# Patient Record
Sex: Male | Born: 1965 | Hispanic: Yes | Marital: Married | Smoking: Former smoker
Health system: Southern US, Community
[De-identification: ages and names within clinical notes are randomized; demographics above are authoritative.]

## PROBLEM LIST (undated history)

## (undated) DIAGNOSIS — G4730 Sleep apnea, unspecified: Secondary | ICD-10-CM

## (undated) DIAGNOSIS — R600 Localized edema: Secondary | ICD-10-CM

## (undated) DIAGNOSIS — I10 Essential (primary) hypertension: Secondary | ICD-10-CM

## (undated) DIAGNOSIS — T7840XA Allergy, unspecified, initial encounter: Secondary | ICD-10-CM

## (undated) DIAGNOSIS — Z9884 Bariatric surgery status: Secondary | ICD-10-CM

## (undated) DIAGNOSIS — Z86718 Personal history of other venous thrombosis and embolism: Secondary | ICD-10-CM

## (undated) DIAGNOSIS — K219 Gastro-esophageal reflux disease without esophagitis: Secondary | ICD-10-CM

## (undated) HISTORY — DX: Essential (primary) hypertension: I10

## (undated) HISTORY — PX: GASTRIC BYPASS: SHX52

## (undated) HISTORY — PX: KNEE ARTHROSCOPY: SUR90

## (undated) HISTORY — DX: Personal history of other venous thrombosis and embolism: Z86.718

## (undated) HISTORY — DX: Allergy, unspecified, initial encounter: T78.40XA

## (undated) HISTORY — DX: Sleep apnea, unspecified: G47.30

---

## 2013-03-15 ENCOUNTER — Ambulatory Visit: Payer: PRIVATE HEALTH INSURANCE | Attending: Family Medicine | Admitting: Family Medicine

## 2013-03-15 ENCOUNTER — Encounter (HOSPITAL_COMMUNITY): Payer: Self-pay | Admitting: Emergency Medicine

## 2013-03-15 ENCOUNTER — Encounter: Payer: Self-pay | Admitting: Family Medicine

## 2013-03-15 ENCOUNTER — Emergency Department (HOSPITAL_COMMUNITY)
Admission: EM | Admit: 2013-03-15 | Discharge: 2013-03-15 | Disposition: A | Discharge status: Home / Self Care | Payer: PRIVATE HEALTH INSURANCE | Source: Home / Self Care

## 2013-03-15 VITALS — BP 145/91 | HR 89 | Temp 98.7°F | Resp 20 | Ht 71.0 in | Wt 394.0 lb

## 2013-03-15 DIAGNOSIS — L089 Local infection of the skin and subcutaneous tissue, unspecified: Secondary | ICD-10-CM

## 2013-03-15 DIAGNOSIS — M79609 Pain in unspecified limb: Secondary | ICD-10-CM | POA: Insufficient documentation

## 2013-03-15 DIAGNOSIS — R600 Localized edema: Secondary | ICD-10-CM

## 2013-03-15 DIAGNOSIS — I878 Other specified disorders of veins: Secondary | ICD-10-CM

## 2013-03-15 DIAGNOSIS — R609 Edema, unspecified: Secondary | ICD-10-CM

## 2013-03-15 DIAGNOSIS — Z0000 Encounter for general adult medical examination without abnormal findings: Secondary | ICD-10-CM

## 2013-03-15 DIAGNOSIS — IMO0001 Reserved for inherently not codable concepts without codable children: Secondary | ICD-10-CM

## 2013-03-15 DIAGNOSIS — L97909 Non-pressure chronic ulcer of unspecified part of unspecified lower leg with unspecified severity: Secondary | ICD-10-CM

## 2013-03-15 DIAGNOSIS — M79661 Pain in right lower leg: Secondary | ICD-10-CM

## 2013-03-15 DIAGNOSIS — I872 Venous insufficiency (chronic) (peripheral): Secondary | ICD-10-CM

## 2013-03-15 DIAGNOSIS — M7989 Other specified soft tissue disorders: Secondary | ICD-10-CM | POA: Insufficient documentation

## 2013-03-15 LAB — CBC WITH DIFFERENTIAL/PLATELET
Basophils Absolute: 0 10*3/uL (ref 0.0–0.1)
Basophils Relative: 0 % (ref 0–1)
Eosinophils Absolute: 0.2 10*3/uL (ref 0.0–0.7)
Eosinophils Relative: 3 % (ref 0–5)
Lymphocytes Relative: 43 % (ref 12–46)
MCHC: 35.7 g/dL (ref 30.0–36.0)
MCV: 87.5 fL (ref 78.0–100.0)
Monocytes Absolute: 0.5 10*3/uL (ref 0.1–1.0)
Platelets: 220 10*3/uL (ref 150–400)
RDW: 12.9 % (ref 11.5–15.5)
WBC: 7 10*3/uL (ref 4.0–10.5)

## 2013-03-15 LAB — LDL CHOLESTEROL, DIRECT: Direct LDL: 105 mg/dL — ABNORMAL HIGH

## 2013-03-15 LAB — COMPREHENSIVE METABOLIC PANEL
ALT: 99 U/L — ABNORMAL HIGH (ref 0–53)
AST: 50 U/L — ABNORMAL HIGH (ref 0–37)
Alkaline Phosphatase: 47 U/L (ref 39–117)
BUN: 11 mg/dL (ref 6–23)
Chloride: 102 mEq/L (ref 96–112)
Creat: 0.89 mg/dL (ref 0.50–1.35)
Total Bilirubin: 0.5 mg/dL (ref 0.3–1.2)

## 2013-03-15 LAB — TSH: TSH: 1.369 u[IU]/mL (ref 0.350–4.500)

## 2013-03-15 MED ORDER — BACITRACIN 500 UNIT/GM EX OINT: 1.0000 "application " | TOPICAL_OINTMENT | Freq: Two times a day (BID) | CUTANEOUS | Status: DC

## 2013-03-15 NOTE — Progress Notes (Signed)
Subjective:     Patient ID: Manuel Price, male   DOB: [birth year], 47 y.o.   MRN: [id number]  HPI Pt here for f/u from an urgent care visit a few hours ago. He was seen there for a small ulceration on the back of his left calf. He also endoresed b/l calf tenderness and swelling both for 15 years. He denies any medical problems and has not seen a pcp for "many many years."    Review of Systems + calf tenderness and swelling, no shob     Objective:   Physical Examulceration on the back of right calf, pencil eraser sized, no erythema surrounding, streaking, or warmth. 1+ pitting edema to mid shin. Morbidly obese.      Assessment:     Skin infection - Plan: bacitracin 500 UNIT/GM ointment  Healthcare maintenance - Plan: CBC with Differential, Comprehensive metabolic panel, Hemoglobin A1c, LDL Cholesterol, Direct, TSH, Vitamin D, 25-hydroxy  Bilateral calf pain - Plan: Lower Extremity Venous Duplex Bilateral       Plan:     - cont the bacitracin as suggested by UC, also shower twice daily and rinse the area with shower head (he has detatchable), re-eval in 3-4 days or earlier if worsens.   - check b/l dopplers agiven pain, swelling, and sedentary lifestyle lthough suscpicion for dvts is low  -  Will have him f/u next tues to re-eval the ulceration and review his dppler results with him. Will draw basic labs today and will review those at that visit. Note that BP elevated today without dx of HTN so will also look at that next week.

## 2013-03-15 NOTE — ED Notes (Signed)
Right lower leg swelling.  15 year history of legs swelling.  Leg worsened over the past year.  Patient has a weeping wound on back of lower leg.

## 2013-03-15 NOTE — Progress Notes (Signed)
Patient presents to establish care; denies pain; denies need for medication refills.

## 2013-03-15 NOTE — ED Provider Notes (Signed)
History     CSN: [id number]  Arrival date & time 03/15/13  1013   First MD Initiated Contact with Patient 03/15/13 1031      Chief Complaint  Patient presents with  . Leg Swelling    (Consider location/radiation/quality/duration/timing/severity/associated sxs/prior treatment) HPI Comments: This is a 48 year old morbidly and severely obese male who has had bilateral leg swelling for over 15 years. His daughter states that it is beginning to get worse in the past 1-1/2 years. The reason for his visit today was the he had recently developed a small opening in the skin on the posterior calf. It is not painful. It is not infected. He denies systemic symptoms. No fever, chest pain or shortness of breath.   History reviewed. No pertinent past medical history.  Past Surgical History  Procedure Laterality Date  . Knee surgery Right     No family history on file.  History  Substance Use Topics  . Smoking status: Never Smoker   . Smokeless tobacco: Not on file  . Alcohol Use: No      Review of Systems  Constitutional: Negative.   Cardiovascular: Negative.   Gastrointestinal: Negative.   Skin: Positive for wound.  Neurological: Negative.     Allergies  Review of patient's allergies indicates no known allergies.  Home Medications   Current Outpatient Rx  Name  Route  Sig  Dispense  Refill  . aspirin 325 MG tablet   Oral   Take 325 mg by mouth daily.           BP 142/85  Pulse 73  Temp(Src) 98.8 F (37.1 C) (Oral)  Resp 17  SpO2 96%  Physical Exam  Nursing note and vitals reviewed. Constitutional: He is oriented to person, place, and time. He appears well-developed and well-nourished.  Eyes: EOM are normal.  Neck: Normal range of motion. Neck supple.  Cardiovascular: Normal rate, regular rhythm and normal heart sounds.   Pulmonary/Chest: Effort normal. No respiratory distress. He has no wheezes.  Musculoskeletal: He exhibits edema.  Bilateral lower  extremity edema. 1+ pitting. Right mildly greater than the left. There is overlying skin changes consistent with venous stasis. There is a 3-4 mm diameter area in which the epidermis is absent. There is no drainage or signs of infection. No erythema or lymphangitis or bleeding. There is no ulceration.  Neurological: He is alert and oriented to person, place, and time. He exhibits normal muscle tone.  Skin: Skin is warm and dry.  Psychiatric: He has a normal mood and affect.    ED Course  Procedures (including critical care time)  Labs Reviewed - No data to display No results found.   1. Venous stasis of lower extremity   2. Venous stasis ulcer, right   3. Bilateral lower extremity edema   4. Morbid obesity       MDM  Chronic bilateral edema of with right slightly greater than left. The appearance is that of venous stasis. The right wound is no and does not appear to be an actual ulcer at this time. There are no signs of infection. We will treat with bacitracin cream or ointment and a Band-Aid. They are to keep it clean and continuing apply the ointment or cream for the next 4-5 days. [facility] for followup appointment probably will need lower bilateral venous Dopplers. The patient is severely a morbidly obese and will need to have medical evaluation for her comorbid conditions. He is stable at  discharge with no other complaints.      Hayden Rasmussen, NP 03/15/13 1136

## 2013-03-16 LAB — VITAMIN D 25 HYDROXY (VIT D DEFICIENCY, FRACTURES): Vit D, 25-Hydroxy: 28 ng/mL — ABNORMAL LOW (ref 30–89)

## 2013-03-17 ENCOUNTER — Ambulatory Visit (HOSPITAL_COMMUNITY): Payer: PRIVATE HEALTH INSURANCE | Attending: Family Medicine

## 2013-03-19 ENCOUNTER — Telehealth: Payer: Self-pay | Admitting: *Deleted

## 2013-03-19 ENCOUNTER — Ambulatory Visit: Payer: PRIVATE HEALTH INSURANCE

## 2013-03-19 NOTE — Telephone Encounter (Signed)
03/19/13 Patient made aware of lab results CBC-wnl  A1c-wnl, Vit. D low per Dr. Lucretia Roers Patient schedule for clinic tomorrow 03/20/13 Will discuss with doctor . [facility] BSN  [facility]

## 2013-03-20 ENCOUNTER — Ambulatory Visit: Payer: PRIVATE HEALTH INSURANCE | Attending: Family Medicine | Admitting: Internal Medicine

## 2013-03-20 VITALS — BP 114/79 | HR 78 | Temp 98.4°F | Resp 18 | Ht 71.0 in | Wt 394.0 lb

## 2013-03-20 DIAGNOSIS — L089 Local infection of the skin and subcutaneous tissue, unspecified: Secondary | ICD-10-CM

## 2013-03-20 DIAGNOSIS — E559 Vitamin D deficiency, unspecified: Secondary | ICD-10-CM | POA: Insufficient documentation

## 2013-03-20 MED ORDER — BACITRACIN 500 UNIT/GM EX OINT: 1.0000 "application " | TOPICAL_OINTMENT | Freq: Two times a day (BID) | CUTANEOUS | Status: DC

## 2013-03-20 MED ORDER — VITAMIN D 400 UNITS PO CAPS: 400.0000 [IU] | ORAL_CAPSULE | Freq: Every day | ORAL | Status: DC

## 2013-03-20 NOTE — Patient Instructions (Signed)
Vitamin D Deficiency  Vitamin D is an important vitamin that your body needs. Having too little of it in your body is called a deficiency. A very bad deficiency can make your bones soft and can cause a condition called rickets.   Vitamin D is important to your body for different reasons, such as:    It helps your body absorb 2 minerals called calcium and phosphorus.   It helps make your bones healthy.   It may prevent some diseases, such as diabetes and multiple sclerosis.   It helps your muscles and heart.  You can get vitamin D in several ways. It is a natural part of some foods. The vitamin is also added to some dairy products and cereals. Some people take vitamin D supplements. Also, your body makes vitamin D when you are in the sun. It changes the sun's rays into a form of the vitamin that your body can use.  CAUSES    Not eating enough foods that contain vitamin D.   Not getting enough sunlight.   Having certain digestive system diseases that make it hard to absorb vitamin D. These diseases include Crohn's disease, chronic pancreatitis, and cystic fibrosis.   Having a surgery in which part of the stomach or small intestine is removed.   Being obese. Fat cells pull vitamin D out of your blood. That means that obese people may not have enough vitamin D left in their blood and in other body tissues.   Having chronic kidney or liver disease.  RISK FACTORS  Risk factors are things that make you more likely to develop a vitamin D deficiency. They include:   Being older.   Not being able to get outside very much.   Living in a nursing home.   Having had broken bones.   Having weak or thin bones (osteoporosis).   Having a disease or condition that changes how your body absorbs vitamin D.   Having dark skin.   Some medicines such as seizure medicines or steroids.   Being overweight or obese.  SYMPTOMS   Mild cases of vitamin D deficiency may not have any symptoms. If you have a very bad case, symptoms may include:   Bone pain.   Muscle pain.   Falling often.   Broken bones caused by a minor injury, due to osteoporosis.  DIAGNOSIS  A blood test is the best way to tell if you have a vitamin D deficiency.  TREATMENT  Vitamin D deficiency can be treated in different ways. Treatment for vitamin D deficiency depends on what is causing it. Options include:   Taking vitamin D supplements.   Taking a calcium supplement. Your caregiver will suggest what dose is best for you.  HOME CARE INSTRUCTIONS   Take any supplements that your caregiver prescribes. Follow the directions carefully. Take only the suggested amount.   Have your blood tested 2 months after you start taking supplements.   Eat foods that contain vitamin D. Healthy choices include:   Fortified dairy products, cereals, or juices. Fortified means vitamin D has been added to the food. Check the label on the package to be sure.   Fatty fish like salmon or trout.   Eggs.   Oysters.   Spend some time in the sun. Most people should get out in the sun without sunblock for 10 to 15 minutes at a time. Do this 3 times a week. People who have had skin cancer should not do this. Ask your   caregiver how long you should be in the sun. Do not use a tanning bed.   Keep your weight at a healthy level. Lose weight if you need to.   Keep all follow-up appointments. Your caregiver will need to perform blood tests to make sure your vitamin D deficiency is going away.  SEEK MEDICAL CARE IF:   You have any questions about your treatment.   You continue to have symptoms of vitamin D deficiency.   You have nausea or vomiting.   You are constipated.   You feel confused.   You have severe abdominal or back pain.  MAKE SURE YOU:   Understand these instructions.   Will watch your condition.   Will get help right away if you are not doing well or get worse.   Document Released: 12/12/2011 Document Reviewed: 12/12/2011  [employer] Patient Information 2014 [employer]

## 2013-03-20 NOTE — Progress Notes (Signed)
Patient ID: Manuel Price, male   DOB: [birth year], 47 y.o.   MRN: [id number]  CC: follow up for labs  HPI: 47 year old male with no significant past medical history who presents to the clinic for followup. Patient reports feeling good today with no chest pain, shortness of breath or palpitations. He came to review the blood work done at previous visit.  No Known Allergies History reviewed. No pertinent past medical history. Current Outpatient Prescriptions on File Prior to Visit  Medication Sig Dispense Refill  . aspirin 325 MG tablet Take 325 mg by mouth daily.       No current facility-administered medications on file prior to visit.   Family History  Problem Relation Age of Onset  . Stroke Mother    History   Social History  . Marital Status: Married    Spouse Name: N/A    Number of Children: N/A  . Years of Education: N/A   Occupational History  . Not on file.   Social History Main Topics  . Smoking status: Never Smoker   . Smokeless tobacco: Not on file  . Alcohol Use: No  . Drug Use: No  . Sexually Active: Not on file   Other Topics Concern  . Not on file   Social History Narrative  . No narrative on file    Review of Systems  Constitutional: Negative for fever, chills, diaphoresis, activity change, appetite change and fatigue.  HENT: Negative for ear pain, nosebleeds, congestion, facial swelling, rhinorrhea, neck pain, neck stiffness and ear discharge.   Eyes: Negative for pain, discharge, redness, itching and visual disturbance.  Respiratory: Negative for cough, choking, chest tightness, shortness of breath, wheezing and stridor.   Cardiovascular: Negative for chest pain, palpitations and leg swelling.  Gastrointestinal: Negative for abdominal distention.  Genitourinary: Negative for dysuria, urgency, frequency, hematuria, flank pain, decreased urine volume, difficulty urinating and dyspareunia.  Musculoskeletal: Negative for back pain, joint swelling,  arthralgias and gait problem.  Neurological: Negative for dizziness, tremors, seizures, syncope, facial asymmetry, speech difficulty, weakness, light-headedness, numbness and headaches.  Hematological: Negative for adenopathy. Does not bruise/bleed easily.  Psychiatric/Behavioral: Negative for hallucinations, behavioral problems, confusion, dysphoric mood, decreased concentration and agitation.    Objective:   Filed Vitals:   03/20/13 1300  BP: 114/79  Pulse: 78  Temp: 98.4 F (36.9 C)  Resp: 18    Physical Exam  Constitutional: Appears well-developed and well-nourished. No distress.  HENT: Normocephalic. External right and left ear normal. Oropharynx is clear and moist.  Eyes: Conjunctivae and EOM are normal. PERRLA, no scleral icterus.  Neck: Normal ROM. Neck supple. No JVD. No tracheal deviation. No thyromegaly.  CVS: RRR, S1/S2 +, no murmurs, no gallops, no carotid bruit.  Pulmonary: Effort and breath sounds normal, no stridor, rhonchi, wheezes, rales.  Abdominal: Soft. BS +,  no distension, tenderness, rebound or guarding.  Musculoskeletal: Normal range of motion. No edema and no tenderness.  Lymphadenopathy: No lymphadenopathy noted, cervical, inguinal. Neuro: Alert. Normal reflexes, muscle tone coordination. No cranial nerve deficit. Skin: Skin is warm and dry. No rash noted. Not diaphoretic. No erythema. No pallor.  Psychiatric: Normal mood and affect. Behavior, judgment, thought content normal.   Lab Results  Component Value Date   WBC 7.0 03/15/2013   HGB 14.8 03/15/2013   HCT 41.4 03/15/2013   MCV 87.5 03/15/2013   PLT 220 03/15/2013   Lab Results  Component Value Date   CREATININE 0.89 03/15/2013   BUN 11 03/15/2013   NA  135 03/15/2013   K 3.9 03/15/2013   CL 102 03/15/2013   CO2 27 03/15/2013    Lab Results  Component Value Date   HGBA1C 5.5 03/15/2013   Lipid Panel  No results found for this basename: chol, trig, hdl, cholhdl, vldl, ldlcalc       Assessment  and plan:   Patient Active Problem List   Diagnosis Date Noted  . Vitamin D deficiency 03/20/2013    Priority: High - Prescription provided for vitamin D capsules 400 mg daily  - Vitamin D level low at 28  - Reviewed TSH, A1c and lipid panel which seem to be under relatively good control

## 2013-03-20 NOTE — Addendum Note (Signed)
Addended by: Alison Murray on: 03/20/2013 01:15 PM   Modules accepted: Orders

## 2013-03-20 NOTE — Progress Notes (Signed)
Patient presents to review labs; also states that he is still using ointment for wound on right leg.

## 2013-04-11 ENCOUNTER — Encounter (HOSPITAL_BASED_OUTPATIENT_CLINIC_OR_DEPARTMENT_OTHER): Payer: PRIVATE HEALTH INSURANCE

## 2013-04-20 ENCOUNTER — Ambulatory Visit (INDEPENDENT_AMBULATORY_CARE_PROVIDER_SITE_OTHER): Payer: PRIVATE HEALTH INSURANCE | Admitting: Emergency Medicine

## 2013-04-20 VITALS — BP 130/82 | HR 82 | Temp 98.3°F | Resp 20 | Ht 72.0 in | Wt 396.4 lb

## 2013-04-20 DIAGNOSIS — J018 Other acute sinusitis: Secondary | ICD-10-CM

## 2013-04-20 DIAGNOSIS — G4733 Obstructive sleep apnea (adult) (pediatric): Secondary | ICD-10-CM

## 2013-04-20 DIAGNOSIS — I87009 Postthrombotic syndrome without complications of unspecified extremity: Secondary | ICD-10-CM

## 2013-04-20 DIAGNOSIS — J019 Acute sinusitis, unspecified: Secondary | ICD-10-CM

## 2013-04-20 MED ORDER — PSEUDOEPHEDRINE-GUAIFENESIN ER 60-600 MG PO TB12: 1.0000 | ORAL_TABLET | Freq: Two times a day (BID) | ORAL | Status: DC

## 2013-04-20 MED ORDER — AMOXICILLIN-POT CLAVULANATE 875-125 MG PO TABS: 1.0000 | ORAL_TABLET | Freq: Two times a day (BID) | ORAL | Status: DC

## 2013-04-20 NOTE — Patient Instructions (Addendum)
Apnea del sueo  (Sleep Apnea)  La apnea del sueo es un trastorno caracterizado por pausas anormales en la respiracin durante el sueo. Cuando la respiracin se detiene, Air cabin crew de oxgeno en la sangre disminuye. Esto hace que salga del sueo profundo y [place] en el sueo ligero. [employer], la calidad del sueo es pobre y el sistema que transporta la sangre a travs del cuerpo (sistema cardiovascular) experimenta estrs. Si la apnea del sueo no se trata, Building services engineer los siguientes problemas:   Presin arterial elevada (hipertensin).  Enfermedad coronaria.  Incapacidad para alcanzar o Designer, fashion/clothing (impotencia).  Deterioro de los procesos de pensamiento (disfuncin cognitiva). Existen tres tipos de apnea del sueo.  1. Apnea del sueo obstructiva - Pausas en la respiracin durante el sueo debido a una obstruccin de las vas areas. 2. Apnea del sueo central -Pausas en la respiracin durante el sueo debido a que la zona del cerebro que controla la respiracin no enva las seales correctas a los msculos que la controlan. 3. Apnea del sueo mixta - [place] combinacin de [place], tanto obstructiva como central. FACTORES DE RIESGO  Sin embargo, los siguientes factores pueden aumentar el riesgo de desarrollar apnea del sueo.   Sobrepeso.  El hbito de fumar.  Pasajes estrechos en la nariz y la garganta.  Edad avanzada.  Sexo masculino.  El consumo de alcohol.  Uso de sedantes y tranquilizantes.  La etnia. Entre los individuos menores de 35 aos, los afroamericanos tienen ms riesgo. SNTOMAS   Dificultad para permanecer dormido.  Somnolencia y Doctor, hospital.  Prdida de energa.  Irritabilidad.  Fuertes ronquidos.  Dolores de cabeza matutinos.  Dificultad para concentrarse.  Olvidos.  Disminucin del inters por el sexo. DIAGNSTICO  Para diagnosticar la apnea del sueo, Public librarian un examen fsico. Su mdico podr indicarle un  estudio del sueo para Transport planner. Tambin podr indicarle que pase la noche en un laboratorio del sueo. En el laboratorio del sueo, varios monitores registran informacin sobre el corazn, los pulmones y el cerebro durante el sueo. Tambin se registran los movimientos de brazos y [place], y Air cabin crew de oxgeno en la sangre.  TRATAMIENTO  Las acciones siguientes pueden ayudar a Oncologist la apnea del sueo leve:   Duerma de lado.   Use un descongestivo, tiene congestin nasal.   Evite el uso de depresores, incluyendo el alcohol, sedantes y narcticos.   Baje de peso y modifique su dieta si tiene sobrepeso. Existen dispositivos y tratamientos para ayudar a abrir las vas respiratorias:   Dispositivos bucales. Son boquillas a medida que modifican ligeramente la posicin de la mandbula inferior [place] adelante y abren la mordida. Esto permite la apertura de las vas respiratorias.  Dispositivos que generan presin positiva en las vas areas. Esta presin positiva [employer] vas respiratorias para ayudar a Therapist, nutritional sueo. Los siguientes dispositivos crean una presin area positiva:  Dispositivo de presin positiva continua de las vas respiratorias (CPAP). Este dispositivo CPAP crea un nivel continuo de presin de aire con [place] bomba de aire. El aire se enva a las vas respiratorias a travs de una mscara durante el sueo. Esta presin continua mantiene las vas areas [place].  Dispositivo de presin espiratoria positiva en las vas areas (EPAP). El dispositivo EPAP crea una presin positiva de aire a medida que se exhala. Consta de vlvulas de un solo uso que se insertan en cada fosa nasal y las mantienen en su  sitio mediante un [employer]. Las vlvulas crean muy poca resistencia al inhalar pero crean resistencia al exhalar. Ese aumento de la resistencia crea una presin de [place]. Esta presin positiva al exhalar mantiene abiertas las vas  respiratorias, lo que facilita la respiracin al inhalar nuevamente.  Dispositivo de presin positiva de [place] [employer] vas areas (BPAP). El dispositivo BPAP se utiliza principalmente en pacientes con apnea del sueo central. Este dispositivo es similar al dispositivo de CPAP ya que utiliza una bomba de aire para suministrar presin de aire continua a travs de Tourist information centre manager. Sin embargo, con el dispositivo BPAP la presin se Naval architect. Al exhalar, la presin es inferior a la presin que se establece al [employer].  Ciruga. Por lo general, la ciruga slo se realiza si no puede cumplir con los tratamientos menos invasivos o si estos no mejoran su requisito. La ciruga consiste en extraer el exceso de tejidos en las vas areas para crear un pasaje ms amplio. Document Released: 06/29/2005 Document Revised: 03/20/2012 [facility] Patient Information 2014 [place], [place]. Sinusitis  (Sinusitis) La sinusitis es el enrojecimiento, dolor e hinchazn (inflamacin) de los senos paranasales. Los senos paranasales son bolsas de aire que se encuentran dentro de los [employer] del rostro (por debajo de los ojos, en la mitad de la frente o por encima de los ojos). En los senos paranasales sanos, el moco es capaz de Sales executive y el aire circula a travs de ellos en su camino hacia la Clinical cytogeneticist. Sin embargo, cuando se [place], el moco y el aire [place]. Esto hace que se desarrollen bacterias y otros grmenes y originen una infeccin.   La sinusitis puede desarrollarse rpidamente y durar slo un tiempo corto (aguda) o continuar por un perodo largo (crnica).. La sinusitis que dura ms de 12 semanas se considera crnica.  CAUSAS  Las causas de la sinusitis son:   [place].  Las [employer], como el desplazamiento del cartlago que separa las fosas nasales (desvo del tabique) pueden disminuir el flujo de aire por la nariz y los senos paranasales y Audiological scientist su drenaje.  Las  alteraciones funcionales, como cuando los pequeos pelos (cilias) que se encuentran en los senos nasales y ayudan a eliminar la mucosidad no funcionan correctamente o no estn presentes. SNTOMAS  Los sntomas de sinusitis aguda y crnica son los mismos. Los sntomas principales son Chief Technology Officer y la presin alrededor de los senos paranasales afectados. Otros sntomas son:   Careers information officer.  Dolor de odos  Dolor de [place].  Mal aliento.  Disminucin del sentido del olfato y del gusto.  Tos, que empeora al [employer]  [place].  Grant Ruts.  Drenaje de moco espeso por la nariz, que generalmente es de color verde y puede contener pus (purulento).  Hinchazn y calor en los senos paranasales afectados. DIAGNSTICO  El mdico le har un examen fsico. Durante el examen, el mdico:   Revisar su nariz buscando signos de crecimientos anormales en las fosas nasales (plipos nasales).  Palpar los senos paranasales afectados para buscar signos de infeccin.  Observar el interior de los senos paranasales (endoscopa) con un dispositivo luminoso especial (endoscopio) con el que tomar imgenes insertndolo en los senos paranasales. Si el mdico sospecha que usted sufre sinusitis crnica, podr indicar una o ms de las siguientes pruebas:   Pruebas de Programmer, multimedia.  Cultivo de secreciones nasales: tomar Lauris Poag del moco nasal y lo enviar a un laboratorio para detectar bacterias.  Citologa nasal: el  mdico tomar Lauris Poag de moco de la nariz para determinar si la sinusitis que usted sufre est relacionada con Vella Raring. TRATAMIENTO  La mayora de los casos de sinusitis aguda se deben a una infeccin viral y se resuelven espontneamente dentro de los [address]. En algunos casos se recetan medicamentos para [employer] (analgsicos, descongestivos, aerosoles nasales con corticoides o aerosoles salinos).  Sin embargo, para la sinusitis por infeccin bacteriana, Engineer, manufacturing systems. Los antibiticos son medicamentos que destruyen las bacterias que causan la infeccin.  Rara vez la sinusitis tiene su origen en una infeccin por hongos. . En estos casos, el mdico le recetar un medicamento antifngico.  Para algunos casos de sinusitis crnica, es necesario someterse a [place]. Generalmente se trata de [employer] la sinusitis se repite ms de 3 veces al ao, a pesar de otros tratamientos.  INSTRUCCIONES PARA EL CUIDADO EN EL HOGAR   Beba gran cantidad de lquidos. Los lquidos ayudan a Optometrist moco para que drene ms fcilmente de los senos paranasales.  Use un humidificador.  Inhale vapor de 3 a 4 veces al da (por ejemplo, sintese en el bao con la ducha abierta).  Aplique un pao tibio y hmedo en el rostro 3  4 veces al da, o segn las indicaciones de su mdico.  Use un aerosol nasal salino para ayudar a Environmental education officer y [employer] senos nasales.  Tome medicamentos de venta libre o recetados para Acupuncturist, Environmental health practitioner o la fiebre slo segn las indicaciones de su mdico. SOLICITE ATENCIN MDICA DE INMEDIATO SI:   Siente ms dolor o sufre dolores de cabeza intensos.  Tiene nuseas, vmitos o somnolencia.  Observa hinchazn alrededor del rostro.  Tiene problemas de visin.  Presenta rigidez en el cuello.  Tiene dificultad para respirar. ASEGRESE DE QUE:   Comprende estas instrucciones.  Controlar su enfermedad.  Solicitar ayuda de inmediato si no mejora o si empeora. Document Released: 06/29/2005 Document Revised: 12/12/2011 [facility] Patient Information 2014 [place], [place]. Estasis venosa e insuficiencia venosa crnica (Venous Stasis and Chronic Venous Insufficiency) A medida que las personas envejecen, las venas de las piernas pueden debilitarse y distenderse. Cuando se debilitan y pierden la capacidad de bombear sangre efectivamente, aparece una enfermedad denominada insuficiencia venosa  crnica o estasis venosa. Casi todas las venas devuelven la sangre al corazn. Esto se produce porque:  La fuerza del corazn que bombea sangre fresca empuja la sangre a volver.  La sangre fluye al corazn debido a la fuerza de gravedad. En las venas profundas de las piernas, la sangre tiene que Production manager contra la fuerza de gravedad y llevar el flujo contra la corriente hacia el corazn. Aqu, los msculos de las piernas se contraen para bombear nuevamente hacia el corazn. Las paredes de las venas son elsticas y Aeronautical engineer venas tienen pequeas vlvulas que solo permiten el flujo de la sangre en una direccin. Cuando los [employer] de las piernas se contraen, empujan hacia adentro, Loss adjuster, chartered las paredes elsticas de las venas. Esto hace que la sangre vuelva hacia arriba, abra las vlvulas y [place] la sangre hacia el corazn. Cuando los msculos de las piernas se Banker, las paredes de las venas tambin lo hacen y las vlvulas del interior de las venas se cierran para prevenir que la sangre fluya hacia atrs. Este mtodo de bombeo de la sangre se denomina bombeo venoso. CAUSAS El bombeo venoso se realiza mejor al caminar y cuando los msculos de las  piernas se contraen. Pero cuando una persona se sienta o permanece de pie, se acumula presin en las venas de las piernas. Las venas profundas generalmente pueden soportar breves perodos de inactividad pero en los perodos prolongados (y con el aumento de la presin) pueden distenderse, debilitarse y daarse las paredes. La presin arterial elevada tambin puede distender y daar las paredes de las venas. Estas venas ya no podrn bombear la sangre al corazn. La hipertensin venosa (presin arterial elevada en las venas) que dura mucho tiempo puede ser causa primaria de insuficiencia venosa crnica. La insuficiencia venosa crnica tambin puede estar originada en:   Una trombosis venosa profunda, enfermedad en la que un trombo (cogulo de sangre) obstruye el flujo de  sangre en la vena.  Flebitis, una inflamacin de una vena superficial que forma un cogulo sanguneo. Otros factores de riesgo para la insuficiencia venosa crnica son:   Deloris Ping.  Embarazo.  Estilo de vida sedentario  El consumo de cigarrillos.  Trabajos que requieren Location manager perodos de pie o Hotel manager.  La edad y el sexo:  Las mujeres entre 40 y 42 aos y los hombres de 58 aos son ms propensos a la insuficiencia venosa crnica. SNTOMAS Los sntomas de insuficiencia venosa crnica son:   Venas varicosas  lceras o laceraciones en la piel.  Lipodermatosclerosis, una enfermedad que afecta la piel por arriba del tobillo, generalmente sobre la superficie interna. Con el tiempo, la piel se vuelve [place], [place] y tirante, y con frecuencia duele. Las personas que sufren esta enfermedad tienen un alto riesgo de Systems developer en la piel.  Enrojecimiento o cambio de color en la piel de la pierna.  Hinchazn DIAGNSTICO El medico podr establecer un diagnstico despus de realizar un exhaustivo examen fsico y Neomia Dear cuidadosa Historia clnica. Para confirmar el diagnstico le indicaran las siguientes pruebas:   Ecografa dplex.  Pletismografa (pruebas de flujo sanguneo).  Venogramas (radiografas utilizando una tintura de Management consultant). TRATAMIENTO El objetivo del tratamiento es la persona vuelva a tener una vida activa y minimizar el dolor o la discapacidad. Generalmente este trastorno no implica una amenaza para la vida ni para Nurse, learning disability, y con el tratamiento adecuado la [place] de las personas pueden continuar con una vida [employer]. En la [employer], los casos leves pueden tratarse de [place] ambulatoria con procedimientos simples. Los mtodos de tratamiento incluyen:   Medias de compresin Best boy, un procedimiento que implica la aplicacin de una inyeccin con una sustancia que "disuelve" las venas  daadas. Otras venas toman la funcin de las venas daadas.  Extirpacin de las venas (un procedimiento que ya no se [place]).  Ciruga de ablacin con lser  Reparacin de vlvulas INSTRUCCIONES PARA EL CUIDADO DOMICILIARIO  Las venas de compresin elstica deben usarse todos [place]. Ayudan a [employer] y a [employer] probabilidades de que el problema [place], pero no lo curan.  Slo tome medicamentos de Sales promotion account executive o prescriptos para Primary school teacher, las [place], o bajar la fiebre segn las indicaciones de su mdico.  El profesional comentar con usted la administracin de otros medicamentos. SOLICITE ATENCIN MDICA SI:  Se siente confundido acerca de cmo tomar los medicamentos.  Presenta enrojecimiento, hinchazn o aumento del dolor en la zona afectada.  Observa una lnea roja que se extiende por arriba o por debajo de la zona afectada.  Observ una laceracin o prdida de la piel en la zona afectada, aunque sea pequea.  Presenta una temperatura oral superior a 38,9 C (102 F).  Se lesiona la zona afectada. SOLICITE ATENCIN MDICA DE INMEDIATO SI:  Se lesiona la zona afectada y observa una herida [place].  El dolor no se [place] o se agrava, an recibiendo Publishing rights manager.  La temperatura oral se eleva sin motivo por encima de 38,9 C (102 F) o segn le indique el mdico.  El pie o el tobillo que se encuentra debajo del rea afectada se adormece sbitamente o siente la zona dbil y Fish farm manager. ASEGRESE QUE:   Comprende estas instrucciones.  Controlar su enfermedad.  Solicitar ayuda de inmediato si no mejora o si empeora. Document Released: 01/05/2009 Document Revised: 12/12/2011 [facility] Patient Information 2014 [place], [place].

## 2013-04-20 NOTE — Progress Notes (Signed)
  Subjective:     Manuel Price is a 47 y.o. male who presents for evaluation of sinus pain. Symptoms include: congestion, cough, fevers, frequent clearing of the throat, headaches, nasal congestion, post nasal drip, sinus pressure and spitting/vomiting mucous. Onset of symptoms was 1 week ago. Symptoms have been gradually worsening since that time. Past history is significant for no history of pneumonia or bronchitis. Patient is a non-smoker.  The following portions of the patient's history were reviewed and updated as appropriate: allergies, current medications, past family history, past medical history, past social history, past surgical history and problem list.  Review of Systems A comprehensive review of systems was negative.   Objective:    BP 130/82  Pulse 82  Temp(Src) 98.3 F (36.8 C) (Oral)  Resp 20  Ht 6' (1.829 m)  Wt 396 lb 6.4 oz (179.806 kg)  BMI 53.75 kg/m2  SpO2 97%  General Appearance:    Alert, cooperative, no distress, appears stated age morbid obesity  Head:    Normocephalic, without obvious abnormality, atraumatic  Eyes:    PERRL, conjunctiva/corneas clear, EOM's intact, fundi    benign, both eyes       Ears:    Normal TM's and external ear canals, both ears  Nose:   Nares normal, septum midline, mucosa normal, no drainage    or sinus tenderness  Throat:   Lips, mucosa, and tongue normal; teeth and gums normal  Neck:   Supple, symmetrical, trachea midline, no adenopathy;       thyroid:  No enlargement/tenderness/nodules; no carotid   bruit or JVD  Back:     Symmetric, no curvature, ROM normal, no CVA tenderness  Lungs:     Clear to auscultation bilaterally, respirations unlabored  Chest wall:    No tenderness or deformity  Heart:    Regular rate and rhythm, S1 and S2 normal, no murmur, rub   or gallop           Extremities:   Extremities RIGHT leg post phlebitic changes no cyanosis or edema  Pulses:   2+ and symmetric all extremities  Skin:   Skin  color, texture, turgor normal, no rashes or lesions  Lymph nodes:   Cervical, supraclavicular, and axillary nodes normal  Neurologic:   CNII-XII intact. Normal strength, sensation and reflexes      throughout      Assessment:    Acute bacterial sinusitis.   obstructive sleep apnea Venous stasis dermatitis  Plan:    Antihistamines per medication orders.  Antibiotics Sleep study Compression stockings

## 2013-04-30 ENCOUNTER — Ambulatory Visit (INDEPENDENT_AMBULATORY_CARE_PROVIDER_SITE_OTHER): Payer: PRIVATE HEALTH INSURANCE | Admitting: Emergency Medicine

## 2013-04-30 ENCOUNTER — Ambulatory Visit: Payer: PRIVATE HEALTH INSURANCE

## 2013-04-30 VITALS — BP 126/96 | HR 71 | Temp 98.4°F | Resp 18 | Wt 395.0 lb

## 2013-04-30 DIAGNOSIS — I878 Other specified disorders of veins: Secondary | ICD-10-CM | POA: Insufficient documentation

## 2013-04-30 DIAGNOSIS — I872 Venous insufficiency (chronic) (peripheral): Secondary | ICD-10-CM

## 2013-04-30 DIAGNOSIS — R05 Cough: Secondary | ICD-10-CM

## 2013-04-30 DIAGNOSIS — K219 Gastro-esophageal reflux disease without esophagitis: Secondary | ICD-10-CM

## 2013-04-30 MED ORDER — OMEPRAZOLE 40 MG PO CPDR: 40.0000 mg | DELAYED_RELEASE_CAPSULE | Freq: Every day | ORAL | Status: DC

## 2013-04-30 NOTE — Patient Instructions (Addendum)
Reflujo gastroesofágico - Adultos   (Gastroesophageal Reflux Disease, Adult)   El reflujo gastroesofágico ocurre cuando el ácido del estómago pasa al esófago. Cuando el ácido entra en contacto con el esófago, el ácido provoca dolor (inflamación) en el esófago. Con el tiempo, pueden formarse pequeños agujeros (úlceras) en el revestimiento del esófago.  CAUSAS   · Exceso de peso corporal. Esto aplica presión sobre el estómago, lo que hace que el ácido del estómago suba hacia el esófago.  · El hábito de fumar Aumenta la producción de ácido en el estómago.  · El consumo de alcohol. Provoca disminución de la presión en el esfínter esofágico inferior (válvula o anillo de músculo entre el esófago y el estómago), permitiendo que el ácido del estómago suba hacia el esófago.  · Cenas a última hora del día y estómago lleno. Aumenta la presión y la producción de ácido en el estómago.  · Malformación en el esfínter esofágico inferior.  A menudo no se halla causa.   SÍNTOMAS   · Ardor y dolor en la parte inferior del pecho detrás del esternón y en la zona media del estómago. Puede ocurrir dos veces por semana o más a menudo.  · Dificultad para tragar.  · Dolor de garganta.  · Tos seca.  · Síntomas similares al asma que incluyen sensación de opresión en el pecho, falta de aire y sibilancias.  DIAGNÓSTICO   El médico diagnosticará el problema basándose en los síntomas. En algunos casos, se indican radiografías y otras pruebas para verificar si hay complicaciones o para comprobar el estado del estómago y el esófago.   TRATAMIENTO   El médico le indicará medicamentos de venta libre o recetados para ayudar a disminuir la producción de ácido. Consulte con su médico antes de empezar o agregar cualquier medicamento nuevo.   INSTRUCCIONES PARA EL CUIDADO EN EL HOGAR   · Modifique los factores que pueda cambiar. Consulte con su médico para solicitar orientación relacionada con la pérdida de peso, dejar de fumar y el consumo de  alcohol.  · Evite las comidas y bebidas que empeoran los problemas, como:  · Bebidas con cafeína o alcohólicas.  · Chocolate.  · Sabores a menta.  · Ajo y cebolla.  · Comidas muy condimentadas.  · Cítricos como naranjas, limones o limas.  · Alimentos que contengan tomate, como salsas, chile y pizza.  · Alimentos fritos y grasos.  · Evite acostarse durante 3 horas antes de irse a dormir o antes de tomar una siesta.  · Haga comidas pequeñas durante el día en lugar de 3 comidas abundantes.  · Use ropas sueltas. No use nada apretado alrededor de la cintura que cause presión en el estómago.  · Levante (eleve) la cabecera de la cama 6 a 8 pulgadas (15 a 20 cm) con bloques de madera. Usar almohadas extra no ayuda.  · Solo tome medicamentos que se pueden comprar sin receta o recetados para el dolor, malestar o fiebre, como le indica el médico.  · No tome aspirina, ibuprofeno ni antiinflamatorios no esteroides.  SOLICITE ATENCIÓN MÉDICA DE INMEDIATO SI:   · Siente dolor en los brazos, el cuello, la mandíbula, los dientes o la espalda.  · El dolor aumenta o cambia la intensidad o la duranción.  · Tiene náuseas, vómitos o sudoración(diaforesis).  · Siente falta de aire o dolor en el pecho, o se desmaya.  · Vomita y el vómito tiene sangre, es de color verde, amarillo, negro o es similar a la borra del   café o tiene sangre.  · Las heces son rojas, sanguinolentas o negras.  Estos síntomas pueden ser signos de otros problemas, como enfermedades cardíacas, hemorragias gástrias o sangrado esofágico.   ASEGÚRESE DE QUE:   · Comprende estas instrucciones.  · Controlará su enfermedad.  · Solicitará ayuda de inmediato si no mejora o si empeora.  Document Released: 06/29/2005 Document Revised: 12/12/2011  ExitCare® Patient Information ©2014 [employer].

## 2013-04-30 NOTE — Progress Notes (Signed)
[facility] [address], [place] [address] [phone]  Date:  04/30/2013   Name:  Manuel Price   DOB:  Sep 24, 1966   MRN:  [id number]  PCP:  No PCP Per Patient    Chief Complaint: Cough and Sore Throat   History of Present Illness:  Manuel Price is a 47 y.o. very pleasant male patient who presents with the following:  Treated 10 days ago for sinusitis and bronchitis with augmentin.  Says still has a cough.  Cough is persistent and associated with pain in anterior chest wall.  No fever or chills. Cough is non productive.  No wheezing or shortness of breath.  No nasal congestion or drainage.  negative nausea or vomiting.  Has not purchased his support hose yet.  Still has pain in legs.  No improvement with over the counter medications or other home remedies. Denies other complaint or health concern today.   Patient Active Problem List   Diagnosis Date Noted  . Venous stasis 04/30/2013  . Vitamin D deficiency 03/20/2013    No past medical history on file.  Past Surgical History  Procedure Laterality Date  . Knee surgery Right   . Knee arthroscopy Right     History  Substance Use Topics  . Smoking status: Never Smoker   . Smokeless tobacco: Not on file  . Alcohol Use: No    Family History  Problem Relation Age of Onset  . Stroke Mother     No Known Allergies  Medication list has been reviewed and updated.  Current Outpatient Prescriptions on File Prior to Visit  Medication Sig Dispense Refill  . aspirin 325 MG tablet Take 325 mg by mouth daily.      . Cholecalciferol (VITAMIN D) 400 UNITS capsule Take 1 capsule (400 Units total) by mouth daily.  30 capsule  3  . amoxicillin-clavulanate (AUGMENTIN) 875-125 MG per tablet Take 1 tablet by mouth 2 (two) times daily.  20 tablet  0  . bacitracin 500 UNIT/GM ointment Apply 1 application topically 2 (two) times daily.  15 g  3   No current facility-administered medications on file  prior to visit.    Review of Systems:  As per HPI, otherwise negative.    Physical Examination: Filed Vitals:   04/30/13 0900  BP: 126/96  Pulse: 71  Temp: 98.4 F (36.9 C)  Resp: 18   Filed Vitals:   04/30/13 0900  Weight: 395 lb (179.171 kg)   Body mass index is 53.56 kg/(m^2). Ideal Body Weight:    GEN: morbid obesity, NAD, Non-toxic, A & O x 3 HEENT: Atraumatic, Normocephalic. Neck supple. No masses, No LAD. Ears and Nose: No external deformity. CV: RRR, No M/G/R. No JVD. No thrill. No extra heart sounds. PULM: CTA B, no wheezes, crackles, rhonchi. No retractions. No resp. distress. No accessory muscle use. ABD: S, NT, ND, +BS. No rebound. No HSM. EXTR: No c/c/e NEURO Normal gait.  PSYCH: Normally interactive. Conversant. Not depressed or anxious appearing.  Calm demeanor.    Assessment and Plan: Cough Reflux prilosec   Signed,  Phillips Odor, MD   [facility] reading (PRIMARY) by  Dr. Dareen Piano.  negative.

## 2013-05-15 ENCOUNTER — Ambulatory Visit (HOSPITAL_BASED_OUTPATIENT_CLINIC_OR_DEPARTMENT_OTHER): Payer: PRIVATE HEALTH INSURANCE | Attending: Emergency Medicine

## 2013-05-15 VITALS — Ht 72.0 in | Wt 385.0 lb

## 2013-05-15 DIAGNOSIS — G4733 Obstructive sleep apnea (adult) (pediatric): Secondary | ICD-10-CM | POA: Insufficient documentation

## 2013-05-26 DIAGNOSIS — R0989 Other specified symptoms and signs involving the circulatory and respiratory systems: Secondary | ICD-10-CM

## 2013-05-26 DIAGNOSIS — R0609 Other forms of dyspnea: Secondary | ICD-10-CM

## 2013-05-26 DIAGNOSIS — G4733 Obstructive sleep apnea (adult) (pediatric): Secondary | ICD-10-CM

## 2013-05-26 NOTE — Addendum Note (Signed)
Addended by: Carmelina Dane on: 05/26/2013 04:57 PM   Modules accepted: Orders

## 2013-05-26 NOTE — Procedures (Signed)
[name], Manuel Price NO.:  [id number]  MEDICAL RECORD NO.:  [id number]          PATIENT TYPE:  OUT  LOCATION:  SLEEP CENTER                 FACILITY:  [facility]  PHYSICIAN:  Clinton D. Maple Hudson, MD, FCCP, FACPDATE OF BIRTH:  03/10/1964  DATE OF STUDY:  05/15/2013                           NOCTURNAL POLYSOMNOGRAM  REFERRING PHYSICIAN:  JEFFERY ANDERSON  REFERRING PRACTITIONER:  Thornton Papas.  INDICATION FOR STUDY:  Hypersomnia with sleep apnea.  EPWORTH SLEEPINESS SCORE:  19/24.  BMI 50.8.  Weight 385 pounds.  Height 73 inches.  Neck 22 inches.  MEDICATIONS:  Charted for review.  SLEEP ARCHITECTURE:  Total sleep time 309.5 minutes.  Sleep efficiency 79.9%.  Stage I was 6.6%, stage II 66.6%, stage III absent. REM 26.8% of total sleep time.  Sleep latency 41.5 minutes. REM latency 91 minutes. Awake after sleep onset 36 minutes.  Arousal index 30.8.  Bedtime medication:  None.  This was a diagnostic NPSG protocol as ordered with no CPAP.  RESPIRATORY DATA:  OXYGEN DATA:  Extremely loud snoring with oxygen desaturation to a nadir of 73% and mean oxygen saturation through the study of 89.5% on room air.  CARDIAC DATA:  Sinus rhythm with sinus pauses up to 2 seconds long.  MOVEMENT-PARASOMNIA:  Occasional limb jerk with little effect on sleep. Bathroom x1.  IMPRESSIONS-RECOMMENDATIONS: 1. Very severe obstructive sleep apnea/hypopnea syndrome, AHI 103.3     per hour with mainly nonsupine sleep and events.  REM AHI 89.6 per     hour.  Very loud snoring with oxygen desaturation to a nadir of 73%     and mean oxygen saturation through the study of 89.5% on room air. 2. This study was ordered as a diagnostic NPSG protocol without CPAP.     CPAP therapy would be the usual initial intervention.  Consider     return for diagnostic CPAP titration study if appropriate.     Clinton D. Maple Hudson, MD, [employer], FACP Diplomate, American Board of Sleep Medicine    CDY/MEDQ   D:  05/26/2013 09:21:25  T:  05/26/2013 09:33:50  Job:  [id number]

## 2013-05-30 ENCOUNTER — Encounter (HOSPITAL_BASED_OUTPATIENT_CLINIC_OR_DEPARTMENT_OTHER): Payer: No Typology Code available for payment source | Attending: Internal Medicine

## 2013-05-30 DIAGNOSIS — L97809 Non-pressure chronic ulcer of other part of unspecified lower leg with unspecified severity: Secondary | ICD-10-CM | POA: Insufficient documentation

## 2013-05-30 DIAGNOSIS — I872 Venous insufficiency (chronic) (peripheral): Secondary | ICD-10-CM | POA: Diagnosis present

## 2013-05-31 ENCOUNTER — Telehealth: Payer: Self-pay

## 2013-05-31 NOTE — Progress Notes (Signed)
[facility]  NAME:  Manuel Price, Manuel Price          ACCOUNT NO.:  [id number]  MEDICAL RECORD NO.:  [id number]      DATE OF BIRTH:  [birth year]  PHYSICIAN:  Maxwell Caul, M.D. VISIT DATE:  05/30/2013                                  OFFICE VISIT   HISTORY:  This is a 47 year old man who was referred from the [facility].  He is here for wound on his right leg.  He has a past history of a wound on the right anterior leg that healed a year or so ago.  He had a right posterior lower extremity wound that has occurred without obvious trauma and he is here for our review of this.  He does not have diabetes, however, he does have significant venous stasis.  He has stockings, although I do not think he is compliant with them.  PAST MEDICAL HISTORY:  Venous stasis, vitamin D deficiency.  PAST SURGICAL HISTORY:  Knee surgery on the right, knee arthroscopy on the right remotely.  MEDICATIONS:  Current medications include aspirin 325 a day and vitamin D 400 units international units daily.  PHYSICAL EXAMINATION:  VITAL SIGNS:  Temperature is 98.6, pulse 90, respirations 16, blood pressure is 133/83.  The wound is on the posterior aspect of his right leg.  He had a small adherent eschar to it which was easily debrided with a #10 scalpel.  He has stasis physiology.  On the anterior part of the right leg is a healed wound as mentioned above.  Circulation does not appear to be compromised.  His ABI on the left leg was 1.15, not able to obtain on the right.  However, there is no clinical evidence of significant arterial insufficiency.  IMPRESSIONS:  Venous stasis ulcer.  This was lightly debrided.  This is small.  We dressed this with collagen and Hydrogel under a Profore Lite wrap.  I am hopeful that this will be healed in a week's time.  His questions were answered through his daughter.  The stockings will be the best mechanism of secondary prevention once  this healed.          ______________________________ Maxwell Caul, M.D.     MGR/MEDQ  D:  05/30/2013  T:  05/31/2013  Job:  [id number]

## 2013-05-31 NOTE — Telephone Encounter (Signed)
Pts daughter is calling to get sleep study results, she states that it has been two weeks now and she has heard nothing. Best# [phone]

## 2013-06-03 NOTE — Telephone Encounter (Signed)
Let them know he has severe sleep apnea and was referred back to the sleep center to start CPAP.

## 2013-06-03 NOTE — Addendum Note (Signed)
Addended by: Carmelina Dane on: 06/03/2013 10:02 AM   Modules accepted: Orders

## 2013-06-03 NOTE — Telephone Encounter (Signed)
Please advise 

## 2013-06-05 ENCOUNTER — Telehealth: Payer: Self-pay | Admitting: Radiology

## 2013-06-05 NOTE — Telephone Encounter (Signed)
Referral sent to [facility], but Dr young did sleep study, will you resend order to correct sleep center?

## 2013-06-06 ENCOUNTER — Encounter (HOSPITAL_BASED_OUTPATIENT_CLINIC_OR_DEPARTMENT_OTHER): Payer: Managed Care, Other (non HMO) | Attending: Internal Medicine

## 2013-06-06 DIAGNOSIS — I87319 Chronic venous hypertension (idiopathic) with ulcer of unspecified lower extremity: Secondary | ICD-10-CM | POA: Insufficient documentation

## 2013-06-06 DIAGNOSIS — L97909 Non-pressure chronic ulcer of unspecified part of unspecified lower leg with unspecified severity: Secondary | ICD-10-CM | POA: Insufficient documentation

## 2013-06-10 NOTE — Telephone Encounter (Signed)
No answer and unable to leave message

## 2013-06-11 ENCOUNTER — Other Ambulatory Visit: Payer: Self-pay

## 2013-06-11 DIAGNOSIS — G4733 Obstructive sleep apnea (adult) (pediatric): Secondary | ICD-10-CM

## 2013-06-11 NOTE — Telephone Encounter (Signed)
Spoke with daughter, gave message from Dr Dareen Piano. Pts daughter will call the Sleep Center.

## 2013-06-13 ENCOUNTER — Telehealth: Payer: Self-pay | Admitting: Neurology

## 2013-06-13 DIAGNOSIS — I87319 Chronic venous hypertension (idiopathic) with ulcer of unspecified lower extremity: Secondary | ICD-10-CM | POA: Diagnosis not present

## 2013-06-13 NOTE — Telephone Encounter (Signed)
refers patient for attended sleep study: Dr. Phillips Odor ( Referral to setup titration appointment)  Height: 73"  Weight: 385 lb   BMI: 50.8  Past Medical History:   Venous stasis - Primary, Morbid obesity, Cough, and  GERD (gastroesophageal reflux disease)   Sleep Symptoms: OSA  Sleep Study Results:  LOCATION: SLEEP CENTER FACILITY: [facility]  PHYSICIAN: Clinton D. Maple Hudson, MD, FCCP, FACPDATE OF BIRTH: [birth year]-1964  DATE OF STUDY: 05/15/2013  NOCTURNAL POLYSOMNOGRAM  REFERRING PHYSICIAN: JEFFERY ANDERSON  REFERRING PRACTITIONER: Thornton Papas.  INDICATION FOR STUDY: Hypersomnia with sleep apnea.  EPWORTH SLEEPINESS SCORE: 19/24. BMI 50.8. Weight 385 pounds. Height  73 inches. Neck 22 inches.  MEDICATIONS: Charted for review.  SLEEP ARCHITECTURE: Total sleep time 309.5 minutes. Sleep efficiency  79.9%. Stage I was 6.6%, stage II 66.6%, stage III absent. REM 26.8% of  total sleep time. Sleep latency 41.5 minutes. REM latency 91 minutes.  Awake after sleep onset 36 minutes. Arousal index 30.8. Bedtime  medication: None. This was a diagnostic NPSG protocol as ordered with  no CPAP.  RESPIRATORY DATA:  OXYGEN DATA: Extremely loud snoring with oxygen desaturation to a nadir  of 73% and mean oxygen saturation through the study of 89.5% on room  air.  CARDIAC DATA: Sinus rhythm with sinus pauses up to 2 seconds long.  MOVEMENT-PARASOMNIA: Occasional limb jerk with little effect on sleep.  Bathroom x1.  IMPRESSIONS-RECOMMENDATIONS:  1. Very severe obstructive sleep apnea/hypopnea syndrome, AHI 103.3  per hour with mainly nonsupine sleep and events. REM AHI 89.6 per  hour. Very loud snoring with oxygen desaturation to a nadir of 73%  and mean oxygen saturation through the study of 89.5% on room air.  2. This study was ordered as a diagnostic NPSG protocol without CPAP.  CPAP therapy would be the usual initial intervention. Consider  return for diagnostic CPAP titration study if  appropriate.  Clinton D. Young, MD, FCCP, FACP  Diplomate, Biomedical engineer of Sleep Medicine    Medications:Amoxicillin-Pot Clavulanate (Tab) AUGMENTIN 875-125 MG Take 1 tablet by mouth 2 (two) times daily. Aspirin (Tab) aspirin 325 MG Take 325 mg by mouth daily. Bacitracin (Ointment) bacitracin 500 UNIT/GM Apply 1 application topically 2 (two) times daily. Cholecalciferol (Cap) Vitamin D 400 UNITS Take 1 capsule (400 Units total) by mouth daily. Omeprazole (Capsule Delayed Release) PRILOSEC 40 MG Take 1 capsule (40 mg total) by mouth daily.    Insurance: [employer]  Please review patient information and submit instructions for scheduling and orders for sleep technologist.  Thank you!

## 2013-06-17 ENCOUNTER — Other Ambulatory Visit: Payer: Self-pay | Admitting: Neurology

## 2013-06-17 DIAGNOSIS — G4733 Obstructive sleep apnea (adult) (pediatric): Secondary | ICD-10-CM

## 2013-06-20 DIAGNOSIS — I87319 Chronic venous hypertension (idiopathic) with ulcer of unspecified lower extremity: Secondary | ICD-10-CM | POA: Diagnosis not present

## 2013-07-12 ENCOUNTER — Ambulatory Visit (INDEPENDENT_AMBULATORY_CARE_PROVIDER_SITE_OTHER): Payer: PRIVATE HEALTH INSURANCE | Admitting: Internal Medicine

## 2013-07-12 ENCOUNTER — Encounter: Payer: Self-pay | Admitting: Internal Medicine

## 2013-07-12 VITALS — BP 122/66 | HR 89 | Ht 71.0 in | Wt >= 6400 oz

## 2013-07-12 DIAGNOSIS — I872 Venous insufficiency (chronic) (peripheral): Secondary | ICD-10-CM

## 2013-07-12 DIAGNOSIS — Z23 Encounter for immunization: Secondary | ICD-10-CM

## 2013-07-12 DIAGNOSIS — G4733 Obstructive sleep apnea (adult) (pediatric): Secondary | ICD-10-CM

## 2013-07-12 DIAGNOSIS — I878 Other specified disorders of veins: Secondary | ICD-10-CM

## 2013-07-12 NOTE — Patient Instructions (Signed)
Go ahead with the second (CPAP) sleep study next week as planned. That will help [place] be sure we get the right pressure and kind of machine for you  You need to try to lose weight  Please be carefull not to fall asleep driving. 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  Order- DME new CPAP autotitrate 5-20, humidifier, mask of choice, supplies  Dx OSA  Booklet on obstructive sleep apnea  Flu vax

## 2013-07-12 NOTE — Progress Notes (Signed)
07/12/09 47 yoM never smoker-referred courtesy of Dr Thornton Papas; sleep study attached; due for another sleep study with CPAP Wednesday next week(? if right day) NPSG 05/15/13- severe obstructive sleep apnea- AHI 103.3/ hr, desat to 73%, weight 385 lbs Daughter here to help translate Spanish. 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 Usual bedtime between 10 PM and 5 AM and short latency. Sometimes up once. He is gained 70 or 80 pounds in the last 2 years. No history of ENT surgery. He has had previous blood clot. Never smoked.  Prior to Admission medications   Medication Sig Start Date End Date Taking? Authorizing Provider  aspirin 325 MG tablet Take 325 mg by mouth daily.   Yes Historical Provider, MD  bacitracin 500 UNIT/GM ointment Apply 1 application topically 2 (two) times daily. 03/20/13  Yes Alison Murray, MD  omeprazole (PRILOSEC) 40 MG capsule Take 1 capsule (40 mg total) by mouth daily. 04/30/13  Yes Phillips Odor, MD   Past Medical History  Diagnosis Date  . H/O blood clots 15 years +  . Sleep apnea    Past Surgical History  Procedure Laterality Date  . Knee surgery Right   . Knee arthroscopy Right   ' Family History  Problem Relation Age of Onset  . Stroke Mother   . Emphysema Maternal Grandmother     non smoker   History   Social History  . Marital Status: Married    Spouse Name: N/A    Number of Children: 3  . Years of Education: N/A   Occupational History  . Holiday representative    Social History Main Topics  . Smoking status: Former Smoker -- 0.20 packs/day for 3 years    Types: Cigarettes    Quit date: 07/12/1998  . Smokeless tobacco: Not on file  . Alcohol Use: No     Comment: rarely  . Drug Use: No  . Sexual Activity: Not on file   Other Topics Concern  . Not on file   Social History Narrative  . No narrative on file   ROS-see HPI Constitutional:   No-   weight loss, night sweats, fevers,  chills, fatigue, lassitude. HEENT:   No-  headaches, difficulty swallowing, tooth/dental problems, sore throat,       No-  sneezing, itching, ear ache, nasal congestion, post nasal drip,  CV:  No-   chest pain, orthopnea, PND, + chronic swelling in lower extremities, anasarca, dizziness, palpitations Resp: +shortness of breath with exertion or at rest.              No-   productive cough,  No non-productive cough,  No- coughing up of blood.              No-   change in color of mucus.  No- wheezing.   Skin: No-   rash or lesions. GI:  No-   heartburn, indigestion, abdominal pain, nausea, vomiting, diarrhea,                 change in bowel habits, loss of appetite GU: No-   dysuria, change in color of urine, no urgency or frequency.  No- flank pain. MS:  No-   joint pain or swelling.  No- decreased range of motion.  No- back pain. Neuro-     nothing unusual Psych:  No- change in mood or affect. No depression or anxiety.  No memory loss.  OBJ- Physical Exam General- Alert, Oriented, Affect-appropriate,  Distress- none acute. +morbidly obese very thick neck Skin- rash-none, lesions- none, excoriation- none Lymphadenopathy- none Head- atraumatic            Eyes- Gross vision intact, PERRLA, conjunctivae and secretions clear            Ears- Hearing, canals-normal            Nose- Clear, no-Septal dev, mucus, polyps, erosion, perforation             Throat- Mallampati IV , mucosa clear , drainage- none, +tonsils touching Neck- flexible , trachea midline, no stridor , thyroid nl, carotid no bruit Chest - symmetrical excursion , unlabored           Heart/CV- RRR , no murmur , no gallop  , no rub, nl s1 s2                           - JVD- none , +edema+1-2 under elastic support hose , varices- none           Lung- clear to P&A, wheeze- none, cough- none , dullness-none, rub- none           Chest wall-  Abd- tender-no, distended-no, bowel sounds-present, HSM- no Br/ Gen/ Rectal- Not done, not  indicated Extrem- cyanosis- none, clubbing, none, atrophy- none, strength- nl Neuro- grossly intact to observation

## 2013-07-12 NOTE — Assessment & Plan Note (Signed)
Blood clot-undefined. He is wearing support hose

## 2013-07-12 NOTE — Assessment & Plan Note (Signed)
Severely overweight importance of weight loss discussed.

## 2013-07-12 NOTE — Assessment & Plan Note (Signed)
We educated on the medical concerns sleep apnea importance of weight possibility to drive carefully. He has already been scheduled for CPAP titration study next week. We'll avoid further delay, we will order home CPAP with autotitration and can adjust that based on download and the results of his CPAP titration study.

## 2013-07-17 ENCOUNTER — Ambulatory Visit (INDEPENDENT_AMBULATORY_CARE_PROVIDER_SITE_OTHER): Payer: PRIVATE HEALTH INSURANCE | Admitting: Neurology

## 2013-07-17 DIAGNOSIS — E662 Morbid (severe) obesity with alveolar hypoventilation: Secondary | ICD-10-CM

## 2013-07-17 DIAGNOSIS — G4733 Obstructive sleep apnea (adult) (pediatric): Secondary | ICD-10-CM

## 2013-07-25 ENCOUNTER — Telehealth: Payer: Self-pay | Admitting: Neurology

## 2013-07-25 NOTE — Telephone Encounter (Signed)
I called and spoke with the patient's daughter who translated her father's recent sleep study. Patient's daughter stated he has started CPAP therapy per Dr. Carollee Leitz  Pulmonary. Patient started CPAP on yesterday 07-24-13. I informed the patient that I will mail a copy of the report to her father fax a copy to Dr. Thornton Papas.

## 2013-07-29 ENCOUNTER — Encounter: Payer: Self-pay | Admitting: *Deleted

## 2013-09-13 ENCOUNTER — Ambulatory Visit (INDEPENDENT_AMBULATORY_CARE_PROVIDER_SITE_OTHER): Payer: Managed Care, Other (non HMO) | Admitting: Internal Medicine

## 2013-09-13 ENCOUNTER — Encounter: Payer: Self-pay | Admitting: Internal Medicine

## 2013-09-13 VITALS — BP 124/68 | HR 92 | Ht 71.0 in | Wt >= 6400 oz

## 2013-09-13 DIAGNOSIS — G4733 Obstructive sleep apnea (adult) (pediatric): Secondary | ICD-10-CM

## 2013-09-13 NOTE — Progress Notes (Signed)
[birth year]09 47 yoM never smoker-referred courtesy of Dr Thornton Papas; sleep study attached; due for another sleep study with CPAP Wednesday next week(? if right day) NPSG 05/15/13- severe obstructive sleep apnea- AHI 103.3/ hr, desat to 73%, weight 385 lbs Daughter here to help translate Spanish. 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 Usual bedtime between 10 PM and 5 AM and short latency. Sometimes up once. He is gained 70 or 80 pounds in the last 2 years. No history of ENT surgery. He has had previous blood clot. Never smoked.  09/13/13- 47 yoM never smoker-referred courtesy of Dr Thornton Papas; followed for severe OSA FOLLOWS FOR: review sleep study from 07-17-13; daughter with patient today- translator. He wishes to be followed here for his sleep apnea. CPAP  titrated to 14 cwp/ [employer] fullface mask. Family confirms he is using it, sleeping better and awake all day now.  ROS-see HPI Constitutional:   No-   weight loss, night sweats, fevers, chills, fatigue, lassitude. HEENT:   No-  headaches, difficulty swallowing, tooth/dental problems, sore throat,       No-  sneezing, itching, ear ache, nasal congestion, post nasal drip,  CV:  No-   chest pain, orthopnea, PND, + chronic swelling in lower extremities, anasarca, dizziness, palpitations Resp: +shortness of breath with exertion or at rest.              No-   productive cough,  No non-productive cough,  No- coughing up of blood.              No-   change in color of mucus.  No- wheezing.   Skin: No-   rash or lesions. GI:  No-   heartburn, indigestion, abdominal pain, nausea, vomiting,  GU:  MS:  No-   joint pain or swelling.  No- decreased range of motion.  No- back pain. Neuro-     nothing unusual Psych:  No- change in mood or affect. No depression or anxiety.  No memory loss.  OBJ- Physical Exam General- Alert, Oriented, Affect-appropriate, Distress- none acute. +morbidly  obese very thick neck Skin- rash-none, lesions- none, excoriation- none Lymphadenopathy- none Head- atraumatic            Eyes- Gross vision intact, PERRLA, conjunctivae and secretions clear            Ears- Hearing, canals-normal            Nose- Clear, no-Septal dev, mucus, polyps, erosion, perforation             Throat- Mallampati IV , mucosa clear , drainage- none, +tonsils touching Neck- flexible , trachea midline, no stridor , thyroid nl, carotid no bruit Chest - symmetrical excursion , unlabored           Heart/CV- RRR , no murmur , no gallop  , no rub, nl s1 s2                           - JVD- none , +edema+1-2 under elastic support hose , varices- none           Lung- clear to P&A, wheeze- none, cough- none , dullness-none, rub- none           Chest wall-  Abd-  Br/ Gen/ Rectal- Not done, not indicated Extrem- cyanosis- none, clubbing, none, atrophy- none, strength- nl Neuro- grossly intact to observation

## 2013-09-13 NOTE — Patient Instructions (Signed)
Order- DME [facility]  Change CPAP to fixed 14   Our goal is to wear CPAP all night, every night

## 2013-10-06 ENCOUNTER — Encounter: Payer: Self-pay | Admitting: Internal Medicine

## 2013-10-06 NOTE — Assessment & Plan Note (Signed)
Good initial start with CPAP 14, good compliance and control

## 2013-11-14 ENCOUNTER — Ambulatory Visit: Payer: Self-pay | Admitting: Internal Medicine

## 2013-12-04 ENCOUNTER — Encounter: Payer: Self-pay | Admitting: Internal Medicine

## 2013-12-12 ENCOUNTER — Ambulatory Visit (INDEPENDENT_AMBULATORY_CARE_PROVIDER_SITE_OTHER): Payer: Managed Care, Other (non HMO) | Admitting: Internal Medicine

## 2013-12-12 ENCOUNTER — Encounter (INDEPENDENT_AMBULATORY_CARE_PROVIDER_SITE_OTHER): Payer: Self-pay

## 2013-12-12 ENCOUNTER — Encounter: Payer: Self-pay | Admitting: Internal Medicine

## 2013-12-12 VITALS — BP 124/54 | HR 98 | Ht 71.5 in | Wt >= 6400 oz

## 2013-12-12 DIAGNOSIS — G4733 Obstructive sleep apnea (adult) (pediatric): Secondary | ICD-10-CM

## 2013-12-12 NOTE — Patient Instructions (Signed)
We can continue CPAP 14/ [employer]  Please call as needed

## 2013-12-12 NOTE — Progress Notes (Signed)
[birth year]09 47 yoM never smoker-referred courtesy of Dr Thornton PapasJeffrey Anderson; sleep study attached; due for another sleep study with CPAP Wednesday next week(? if right day) NPSG 05/15/13- severe obstructive sleep apnea- AHI 103.3/ hr, desat to 73%, weight 385 lbs Daughter here to help translate Spanish. 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 Usual bedtime between 10 PM and 5 AM and short latency. Sometimes up once. He is gained 70 or 80 pounds in the last 2 years. No history of ENT surgery. He has had previous blood clot. Never smoked.  09/13/13- 47 yoM never smoker-referred courtesy of Dr Thornton PapasJeffrey Anderson; followed for severe OSA FOLLOWS FOR: review sleep study from 07-17-13; daughter with patient today- translator. He wishes to be followed here for his sleep apnea. CPAP  titrated to 14 cwp/ [employer] fullface mask. Family confirms he is using it, sleeping better and awake all day now.  12/12/13- 47 yoM never smoker-referred courtesy of Dr Thornton PapasJeffrey Anderson; followed for severe OSA FOLLOWS FOR: Has been wearing CPAP 14/ [employer] 6 hours per night, every night. Pt states he feels he is resting well. Daughter with pt.   ROS-see HPI Constitutional:   No-   weight loss, night sweats, fevers, chills, fatigue, lassitude. HEENT:   No-  headaches, difficulty swallowing, tooth/dental problems, sore throat,       No-  sneezing, itching, ear ache, nasal congestion, post nasal drip,  CV:  No-   chest pain, orthopnea, PND, + chronic swelling in lower extremities, anasarca, dizziness, palpitations Resp: +shortness of breath with exertion or at rest.              No-   productive cough,  No non-productive cough,  No- coughing up of blood.              No-   change in color of mucus.  No- wheezing.   Skin: No-   rash or lesions. GI:  No-   heartburn, indigestion, abdominal pain, nausea, vomiting,  GU:  MS:  No-   joint pain or swelling.  Neuro-     nothing  unusual Psych:  No- change in mood or affect. No depression or anxiety.  No memory loss.  OBJ- Physical Exam General- Alert, Oriented, Affect-appropriate, Distress- none acute. +morbidly obese very thick neck Skin- rash-none, lesions- none, excoriation- none Lymphadenopathy- none Head- atraumatic            Eyes- Gross vision intact, PERRLA, conjunctivae and secretions clear            Ears- Hearing, canals-normal            Nose- Clear, no-Septal dev, mucus, polyps, erosion, perforation             Throat- Mallampati IV , mucosa clear , drainage- none, +tonsils touching Neck- flexible , trachea midline, no stridor , thyroid nl, carotid no bruit Chest - symmetrical excursion , unlabored           Heart/CV- RRR , no murmur , no gallop  , no rub, nl s1 s2                           - JVD- none , +edema+1-2 under elastic support hose , varices- none           Lung- clear to P&A, wheeze- none, cough- none , dullness-none, rub- none           Chest wall-  Abd-  Br/ Gen/ Rectal- Not done, not indicated Extrem- cyanosis- none, clubbing, none, atrophy- none, strength- nl Neuro- grossly intact to observation

## 2013-12-29 ENCOUNTER — Encounter: Payer: Self-pay | Admitting: Internal Medicine

## 2013-12-29 NOTE — Assessment & Plan Note (Signed)
Continues good compliance and control but is unable to lose weight

## 2013-12-29 NOTE — Assessment & Plan Note (Signed)
Counseling done 

## 2014-04-20 ENCOUNTER — Ambulatory Visit (INDEPENDENT_AMBULATORY_CARE_PROVIDER_SITE_OTHER): Payer: Self-pay | Admitting: Emergency Medicine

## 2014-04-20 VITALS — BP 124/78 | HR 101 | Temp 102.2°F | Resp 18 | Ht 71.5 in | Wt 390.0 lb

## 2014-04-20 DIAGNOSIS — Z23 Encounter for immunization: Secondary | ICD-10-CM

## 2014-04-20 DIAGNOSIS — R509 Fever, unspecified: Secondary | ICD-10-CM

## 2014-04-20 LAB — POCT URINALYSIS DIPSTICK
Bilirubin, UA: NEGATIVE
Blood, UA: NEGATIVE
Glucose, UA: NEGATIVE
KETONES UA: NEGATIVE
Leukocytes, UA: NEGATIVE
Nitrite, UA: NEGATIVE
SPEC GRAV UA: 1.015
Urobilinogen, UA: 0.2
pH, UA: 6.5

## 2014-04-20 LAB — POCT UA - MICROSCOPIC ONLY
Casts, Ur, LPF, POC: NEGATIVE
Crystals, Ur, HPF, POC: NEGATIVE
YEAST UA: NEGATIVE

## 2014-04-20 LAB — CBC WITH DIFFERENTIAL/PLATELET
BASOS ABS: 0 10*3/uL (ref 0.0–0.1)
Basophils Relative: 0 % (ref 0–1)
EOS PCT: 0 % (ref 0–5)
Eosinophils Absolute: 0 10*3/uL (ref 0.0–0.7)
HEMATOCRIT: 42.4 % (ref 39.0–52.0)
Hemoglobin: 15.2 g/dL (ref 13.0–17.0)
LYMPHS PCT: 23 % (ref 12–46)
Lymphs Abs: 1.2 10*3/uL (ref 0.7–4.0)
MCH: 31.7 pg (ref 26.0–34.0)
MCHC: 35.8 g/dL (ref 30.0–36.0)
MCV: 88.5 fL (ref 78.0–100.0)
MONO ABS: 0.5 10*3/uL (ref 0.1–1.0)
Monocytes Relative: 9 % (ref 3–12)
Neutro Abs: 3.5 10*3/uL (ref 1.7–7.7)
Neutrophils Relative %: 68 % (ref 43–77)
Platelets: 188 10*3/uL (ref 150–400)
RBC: 4.79 MIL/uL (ref 4.22–5.81)
RDW: 12.7 % (ref 11.5–15.5)
WBC: 5.2 10*3/uL (ref 4.0–10.5)

## 2014-04-20 LAB — POCT SEDIMENTATION RATE: POCT SED RATE: 35 mm/hr — AB (ref 0–22)

## 2014-04-20 MED ORDER — ACETAMINOPHEN 325 MG PO TABS: 500.0000 mg | ORAL_TABLET | Freq: Once | ORAL | Status: DC

## 2014-04-20 MED ORDER — ACETAMINOPHEN 325 MG PO TABS
1000.0000 mg | ORAL_TABLET | Freq: Once | ORAL | Status: AC
  Administered 2014-04-20: 1000 mg via ORAL

## 2014-04-20 NOTE — Progress Notes (Signed)
[facility] [address], [place] [address] [phone]  Date:  04/20/2014   Name:  Manuel GreathouseFrancisco Price   DOB:  06/03/1966   MRN:  [id number]  PCP:  No PCP Per Patient    Chief Complaint: Fever and Generalized Body Aches   History of Present Illness:  Manuel Price is a 48 y.o. very pleasant male patient who presents with the following:  Has a fever since Thursday morning. Has joint pain and muscle ache.  No rash, cough, coryza, runny nose, sore throat.  No nausea or vomiting.  No stool change.  No improvement with tylenol.  No rash.  No ill contact.  Denies tick contact  Patient Active Problem List   Diagnosis Date Noted  . Obstructive sleep apnea 07/12/2013  . Venous stasis 04/30/2013  . Morbid obesity 04/30/2013  . Vitamin D deficiency 03/20/2013    Past Medical History  Diagnosis Date  . H/O blood clots 15 years +  . Sleep apnea     Past Surgical History  Procedure Laterality Date  . Knee surgery Right   . Knee arthroscopy Right     History  Substance Use Topics  . Smoking status: Former Smoker -- 0.20 packs/day for 3 years    Types: Cigarettes    Quit date: 07/12/1998  . Smokeless tobacco: Not on file  . Alcohol Use: No     Comment: rarely    Family History  Problem Relation Age of Onset  . Stroke Mother   . Emphysema Maternal Grandmother     non smoker    No Known Allergies  Medication list has been reviewed and updated.  Current Outpatient Prescriptions on File Prior to Visit  Medication Sig Dispense Refill  . aspirin 325 MG tablet Take 325 mg by mouth. One every other day.       No current facility-administered medications on file prior to visit.    Review of Systems:  As per HPI, otherwise negative.    Physical Examination: Filed Vitals:   04/20/14 0849  BP: 124/78  Pulse: 101  Temp: 102.2 F (39 C)  Resp: 18   Filed Vitals:   04/20/14 0849  Height: 5' 11.5" (1.816 m)  Weight: 390 lb (176.903 kg)    Body mass index is 53.64 kg/(m^2). Ideal Body Weight: Weight in (lb) to have BMI = 25: 181.4  GEN: morbid obesity, NAD, Non-toxic, A & O x 3 HEENT: Atraumatic, Normocephalic. Neck supple. No masses, No LAD. Ears and Nose: No external deformity. CV: RRR, No M/G/R. No JVD. No thrill. No extra heart sounds. PULM: CTA B, no wheezes, crackles, rhonchi. No retractions. No resp. distress. No accessory muscle use. ABD: S, NT, ND, +BS. No rebound. No HSM. EXTR: No c/c/e NEURO Normal gait.  PSYCH: Normally interactive. Conversant. Not depressed or anxious appearing.  Calm demeanor.    Assessment and Plan: Viral febrile illness  Signed,  Phillips OdorJeffery Anderson, MD   Results for orders placed in visit on 04/20/14  POCT URINALYSIS DIPSTICK      Result Value Ref Range   Color, UA yellow     Clarity, UA clear     Glucose, UA neg     Bilirubin, UA neg     Ketones, UA neg     Spec Grav, UA 1.015     Blood, UA neg     pH, UA 6.5     Protein, UA trace     Urobilinogen, UA 0.2  Nitrite, UA neg     Leukocytes, UA Negative    POCT UA - MICROSCOPIC ONLY      Result Value Ref Range   WBC, Ur, HPF, POC 1-3     RBC, urine, microscopic 0-1     Bacteria, U Microscopic trace     Mucus, UA trace     Epithelial cells, urine per micros 1-2     Crystals, Ur, HPF, POC neg     Casts, Ur, LPF, POC neg     Yeast, UA neg

## 2014-12-15 ENCOUNTER — Ambulatory Visit: Payer: Self-pay | Admitting: Internal Medicine

## 2014-12-15 ENCOUNTER — Encounter: Payer: Self-pay | Admitting: Internal Medicine

## 2014-12-15 ENCOUNTER — Encounter (INDEPENDENT_AMBULATORY_CARE_PROVIDER_SITE_OTHER): Payer: Self-pay

## 2014-12-15 VITALS — BP 122/84 | HR 75 | Ht 71.5 in | Wt 390.2 lb

## 2014-12-15 DIAGNOSIS — G4733 Obstructive sleep apnea (adult) (pediatric): Secondary | ICD-10-CM

## 2014-12-15 NOTE — Patient Instructions (Signed)
Order- DME [facility]  Reduce CPAP pressure to 12  Dx OSA               Need download for pressure compliance. [facility] if available

## 2014-12-15 NOTE — Progress Notes (Signed)
07/12/09 47 yoM never smoker-referred courtesy of Dr Thornton Papas; sleep study attached; due for another sleep study with CPAP Wednesday next week(? if right day) NPSG 05/15/13- severe obstructive sleep apnea- AHI 103.3/ hr, desat to 73%, weight 385 lbs Daughter here to help translate Spanish. 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 Usual bedtime between 10 PM and 5 AM and short latency. Sometimes up once. He is gained 70 or 80 pounds in the last 2 years. No history of ENT surgery. He has had previous blood clot. Never smoked.  09/13/13- 47 yoM never smoker-referred courtesy of Dr Thornton Papas; followed for severe OSA FOLLOWS FOR: review sleep study from 07-17-13; daughter with patient today- translator. He wishes to be followed here for his sleep apnea. CPAP  titrated to 14 cwp/ [facility] fullface mask. Family confirms he is using it, sleeping better and awake all day now.  12/12/13- 47 yoM never smoker-referred courtesy of Dr Thornton Papas; followed for severe OSA FOLLOWS FOR: Has been wearing CPAP 14/ [facility] 6 hours per night, every night. Pt states he feels he is resting well. Daughter with pt.   12/15/14- 48 yoM never smoker-referred courtesy of Dr Thornton Papas; followed for severe OSA FOLLOWS FOR: Wears CPAP 14 every night for about 6 hours; DME is [facility]. No supplies needed at this time. Will need to request DL as DL not in [facility].   ROS-see HPI Constitutional:   No-   weight loss, night sweats, fevers, chills, fatigue, lassitude. HEENT:   No-  headaches, difficulty swallowing, tooth/dental problems, sore throat,       No-  sneezing, itching, ear ache, nasal congestion, post nasal drip,  CV:  No-   chest pain, orthopnea, PND, + chronic swelling in lower extremities, anasarca, dizziness, palpitations Resp: +shortness of breath with exertion or at rest.              No-   productive cough,  No non-productive cough,  No-  coughing up of blood.              No-   change in color of mucus.  No- wheezing.   Skin: No-   rash or lesions. GI:  No-   heartburn, indigestion, abdominal pain, nausea, vomiting,  GU:  MS:  No-   joint pain or swelling.  Neuro-     nothing unusual Psych:  No- change in mood or affect. No depression or anxiety.  No memory loss.  OBJ- Physical Exam General- Alert, Oriented, Affect-appropriate, Distress- none acute. +morbidly obese very thick neck Skin- rash-none, lesions- none, excoriation- none Lymphadenopathy- none Head- atraumatic            Eyes- Gross vision intact, PERRLA, conjunctivae and secretions clear            Ears- Hearing, canals-normal            Nose- Clear, no-Septal dev, mucus, polyps, erosion, perforation             Throat- Mallampati IV , mucosa clear , drainage- none, +tonsils touching Neck- flexible , trachea midline, no stridor , thyroid nl, carotid no bruit Chest - symmetrical excursion , unlabored           Heart/CV- RRR , no murmur , no gallop  , no rub, nl s1 s2                           -  JVD- none , +edema+1-2 under elastic support hose , varices- none           Lung- clear to P&A, wheeze- none, cough- none , dullness-none, rub- none           Chest wall-  Abd-  Br/ Gen/ Rectal- Not done, not indicated Extrem- cyanosis- none, clubbing, none, atrophy- none, strength- nl Neuro- grossly intact to observation

## 2015-12-15 ENCOUNTER — Ambulatory Visit: Payer: Self-pay | Admitting: Internal Medicine

## 2015-12-17 ENCOUNTER — Ambulatory Visit: Payer: Self-pay | Admitting: Internal Medicine

## 2016-01-05 ENCOUNTER — Ambulatory Visit (INDEPENDENT_AMBULATORY_CARE_PROVIDER_SITE_OTHER): Payer: Self-pay | Admitting: Physician Assistant

## 2016-01-05 VITALS — BP 118/82 | HR 84 | Temp 98.7°F | Resp 18 | Ht 71.0 in | Wt 379.0 lb

## 2016-01-05 DIAGNOSIS — N3090 Cystitis, unspecified without hematuria: Secondary | ICD-10-CM

## 2016-01-05 DIAGNOSIS — R52 Pain, unspecified: Secondary | ICD-10-CM

## 2016-01-05 LAB — POCT CBC
Granulocyte percent: 50.1 %G (ref 37–80)
HCT, POC: 45.6 % (ref 43.5–53.7)
Hemoglobin: 16 g/dL (ref 14.1–18.1)
Lymph, poc: 2.4 (ref 0.6–3.4)
MCH: 32 pg — AB (ref 27–31.2)
MCHC: 35 g/dL (ref 31.8–35.4)
MCV: 91.3 fL (ref 80–97)
MID (CBC): 0.4 (ref 0–0.9)
MPV: 7.6 fL (ref 0–99.8)
PLATELET COUNT, POC: 195 10*3/uL (ref 142–424)
POC Granulocyte: 2.8 (ref 2–6.9)
POC LYMPH PERCENT: 42.3 %L (ref 10–50)
POC MID %: 7.6 %M (ref 0–12)
RBC: 4.99 M/uL (ref 4.69–6.13)
RDW, POC: 11.6 %
WBC: 5.6 10*3/uL (ref 4.6–10.2)

## 2016-01-05 LAB — POCT INFLUENZA A/B
INFLUENZA A, POC: NEGATIVE
Influenza B, POC: NEGATIVE

## 2016-01-05 LAB — POCT URINALYSIS DIP (MANUAL ENTRY)
Bilirubin, UA: NEGATIVE
GLUCOSE UA: NEGATIVE
Ketones, POC UA: NEGATIVE
NITRITE UA: NEGATIVE
Spec Grav, UA: 1.025
UROBILINOGEN UA: 0.2
pH, UA: 5

## 2016-01-05 LAB — POC MICROSCOPIC URINALYSIS (UMFC): Mucus: ABSENT

## 2016-01-05 MED ORDER — CIPROFLOXACIN HCL 500 MG PO TABS: 500.0000 mg | ORAL_TABLET | Freq: Two times a day (BID) | ORAL | Status: AC

## 2016-01-05 NOTE — Progress Notes (Signed)
[facility] [address], [place] [address] [phone]  Date:  01/05/2016   Name:  Manuel GreathouseFrancisco Price   DOB:  10/28/1965   MRN:  [id number]  PCP:  No PCP Per Patient    Chief Complaint: Chills and Generalized Body Aches   History of Present Illness:  This is a 50 y.o. male with PMH OSA, vit D def, obesity who is presenting with 4 days of body aches and chills. Has not checked temp at home. Having a little burning with urination. Feels his urine is very hot. Having some mid back pain bilaterally. Denies abdominal pain or penile discharge. He does feel he is urinating more often. Taking tylenol and has helped some. Hasn;t taken any yet today. Has never had a UTI or kidney stone before. States his symptoms started after visiting his sister in the hospital, he is wondering if he might have picked something up there.  Going to a bariatric clinic for his weight. He was started on phentermine 1 month ago. Dose was increased 1 week ago.  Review of Systems:  Review of Systems See HPI   Patient Active Problem List   Diagnosis Date Noted  . Obstructive sleep apnea 07/12/2013  . Venous stasis 04/30/2013  . Morbid obesity (HCC) 04/30/2013  . Vitamin D deficiency 03/20/2013    Prior to Admission medications   Medication Sig Start Date End Date Taking? Authorizing Provider  acetaminophen (TYLENOL) 500 MG tablet Take 500 mg by mouth every 6 (six) hours as needed. Reported on 01/05/2016   Yes Historical Provider, MD  aspirin 325 MG tablet Take 325 mg by mouth. Reported on 01/05/2016    Historical Provider, MD    No Known Allergies  Past Surgical History  Procedure Laterality Date  . Knee surgery Right   . Knee arthroscopy Right     Social History  Substance Use Topics  . Smoking status: Former Smoker -- 0.20 packs/day for 3 years    Types: Cigarettes    Quit date: 07/12/1998  . Smokeless tobacco: None  . Alcohol Use: No     Comment: rarely    Family  History  Problem Relation Age of Onset  . Stroke Mother   . Emphysema Maternal Grandmother     non smoker    Medication list has been reviewed and updated.  Physical Examination:  Physical Exam  Constitutional: He is oriented to person, place, and time. He appears well-developed and well-nourished. No distress.  HENT:  Head: Normocephalic and atraumatic.  Right Ear: Hearing, tympanic membrane, external ear and ear canal normal.  Left Ear: Hearing, tympanic membrane, external ear and ear canal normal.  Nose: Nose normal.  Mouth/Throat: Uvula is midline and mucous membranes are normal. Posterior oropharyngeal erythema present. No oropharyngeal exudate or posterior oropharyngeal edema.  Eyes: Conjunctivae and lids are normal. Right eye exhibits no discharge. Left eye exhibits no discharge. No scleral icterus.  Cardiovascular: Normal rate, regular rhythm, normal heart sounds and normal pulses.   No murmur heard. Pulmonary/Chest: Effort normal and breath sounds normal. No respiratory distress. He has no wheezes. He has no rhonchi. He has no rales.  Abdominal: Soft. There is no tenderness. There is no CVA tenderness.  obese  Musculoskeletal: Normal range of motion.  Lymphadenopathy:       Head (right side): No submental, no submandibular and no tonsillar adenopathy present.       Head (left side): No submental, no submandibular and no tonsillar adenopathy present.  He has no cervical adenopathy.  Neurological: He is alert and oriented to person, place, and time.  Skin: Skin is warm, dry and intact. No lesion and no rash noted.  Psychiatric: He has a normal mood and affect. His speech is normal and behavior is normal. Thought content normal.   BP 118/82 mmHg  Pulse 84  Temp(Src) 98.7 F (37.1 C)  Resp 18  Ht  (1.803 m)  Wt 379 lb (171.913 kg)  BMI 52.88 kg/m2  SpO2 96%  Results for orders placed or performed in visit on 01/05/16  POCT CBC  Result Value Ref Range   WBC  5.6 4.6 - 10.2 K/uL   Lymph, poc 2.4 0.6 - 3.4   POC LYMPH PERCENT 42.3 10 - 50 %L   MID (cbc) 0.4 0 - 0.9   POC MID % 7.6 0 - 12 %M   POC Granulocyte 2.8 2 - 6.9   Granulocyte percent 50.1 37 - 80 %G   RBC 4.99 4.69 - 6.13 M/uL   Hemoglobin 16.0 14.1 - 18.1 g/dL   HCT, POC 16.1 09.6 - 53.7 %   MCV 91.3 80 - 97 fL   MCH, POC 32.0 (A) 27 - 31.2 pg   MCHC 35.0 31.8 - 35.4 g/dL   RDW, POC 04.5 %   Platelet Count, POC 195 142 - 424 K/uL   MPV 7.6 0 - 99.8 fL  POCT Influenza A/B  Result Value Ref Range   Influenza A, POC Negative Negative   Influenza B, POC Negative Negative  POCT urinalysis dipstick  Result Value Ref Range   Color, UA yellow yellow   Clarity, UA cloudy (A) clear   Glucose, UA negative negative   Bilirubin, UA negative negative   Ketones, POC UA negative negative   Spec Grav, UA 1.025    Blood, UA large (A) negative   pH, UA 5.0    Protein Ur, POC =100 (A) negative   Urobilinogen, UA 0.2    Nitrite, UA Negative Negative   Leukocytes, UA moderate (2+) (A) Negative  POCT Microscopic Urinalysis ([facility])  Result Value Ref Range   WBC,UR,HPF,POC Too numerous to count  (A) None WBC/hpf   RBC,UR,HPF,POC Many (A) None RBC/hpf   Bacteria Few (A) None, Too numerous to count   Mucus Absent Absent   Epithelial Cells, UR Per Microscopy Moderate (A) None, Too numerous to count cells/hpf   Assessment and Plan:  1. Cystitis 2. Body aches CBC wnl and flu test negative. UA with large blood and tntc wbc. Suspect cystitis vs kidney stone. Urine culture pending. Treat with cipro 500 mg bid x 7 days. Gave urine strainer, return with stone if passes. If develops another UTI, will refer to urology at that time. If urine clx negative, will advise pt to go to health dept for STD testing. Return in 1 week if symptoms do not improve or at any time if symptoms worsen.  - Urine culture - ciprofloxacin (CIPRO) 500 MG tablet; Take 1 tablet (500 mg total) by mouth 2 (two) times daily.   Dispense: 14 tablet; Refill: 0 - POCT CBC - POCT Influenza A/B - POCT urinalysis dipstick - POCT Microscopic Urinalysis ([facility])   [employer] [employer]. Dyke Brackett, MHS [facility]  01/05/2016

## 2016-01-05 NOTE — Patient Instructions (Addendum)
Drink plenty of water (at least 64 oz per day). Take antibiotic as prescribed until finished - twice a day for 7 days. Cranberry juice/pills can help. I will call you with results from your urine culture. If you get another UTI, you will need to be referred to urology for further evaluation. Strain your urine every time you urinate until your symptoms get better. If you pass a stone, bring it into the office so we can analyze it. If symptoms are not improving in 1 week or if at any time your symptoms worsen, return to clinic.     IF you received an x-ray today, you will receive an invoice from [facility]. Please contact [facility] at [phone] with questions or concerns regarding your invoice.   IF you received labwork today, you will receive an invoice from [facility]. Please contact [facility] at [phone] with questions or concerns regarding your invoice.   Our billing staff will not be able to assist you with questions regarding bills from these companies.  You will be contacted with the lab results as soon as they are available. The fastest way to get your results is to activate your My Chart account. Instructions are located on the last page of this paperwork. If you have not heard from us regarding the results in 2 weeks, please contact this office.

## 2016-01-07 LAB — URINE CULTURE: Colony Count: >=100000

## 2016-07-15 ENCOUNTER — Ambulatory Visit (INDEPENDENT_AMBULATORY_CARE_PROVIDER_SITE_OTHER): Payer: Self-pay | Admitting: Family Medicine

## 2016-07-15 DIAGNOSIS — Z23 Encounter for immunization: Secondary | ICD-10-CM

## 2016-10-17 ENCOUNTER — Ambulatory Visit (INDEPENDENT_AMBULATORY_CARE_PROVIDER_SITE_OTHER): Payer: Self-pay | Admitting: Emergency Medicine

## 2016-10-17 VITALS — BP 118/84 | HR 83 | Temp 98.9°F | Resp 20 | Ht 71.0 in | Wt 399.0 lb

## 2016-10-17 DIAGNOSIS — R059 Cough, unspecified: Secondary | ICD-10-CM

## 2016-10-17 DIAGNOSIS — R05 Cough: Secondary | ICD-10-CM

## 2016-10-17 DIAGNOSIS — R0989 Other specified symptoms and signs involving the circulatory and respiratory systems: Secondary | ICD-10-CM

## 2016-10-17 DIAGNOSIS — J209 Acute bronchitis, unspecified: Secondary | ICD-10-CM

## 2016-10-17 MED ORDER — BENZONATATE 200 MG PO CAPS: 200.0000 mg | ORAL_CAPSULE | Freq: Two times a day (BID) | ORAL | 0 refills | Status: DC | PRN

## 2016-10-17 MED ORDER — AZITHROMYCIN 250 MG PO TABS: ORAL_TABLET | ORAL | 0 refills | Status: DC

## 2016-10-17 NOTE — Patient Instructions (Addendum)
     IF you received an x-ray today, you will receive an invoice from [facility]. Please contact [facility] at [phone] with questions or concerns regarding your invoice.   IF you received labwork today, you will receive an invoice from [facility]. Please contact [facility] at [phone] with questions or concerns regarding your invoice.   Our billing staff will not be able to assist you with questions regarding bills from these companies.  You will be contacted with the lab results as soon as they are available. The fastest way to get your results is to activate your My Chart account. Instructions are located on the last page of this paperwork. If you have not heard from us regarding the results in 2 weeks, please contact this office.      Acute Bronchitis, Adult Acute bronchitis is when air tubes (bronchi) in the lungs suddenly get swollen. The condition can make it hard to breathe. It can also cause these symptoms:  A cough.  Coughing up clear, yellow, or green mucus.  Wheezing.  Chest congestion.  Shortness of breath.  A fever.  Body aches.  Chills.  A sore throat.  Follow these instructions at home: Medicines  Take over-the-counter and prescription medicines only as told by your doctor.  If you were prescribed an antibiotic medicine, take it as told by your doctor. Do not stop taking the antibiotic even if you start to feel better. General instructions  Rest.  Drink enough fluids to keep your pee (urine) clear or pale yellow.  Avoid smoking and secondhand smoke. If you smoke and you need help quitting, ask your doctor. Quitting will help your lungs heal faster.  Use an inhaler, cool mist vaporizer, or humidifier as told by your doctor.  Keep all follow-up visits as told by your doctor. This is important. How is this prevented? To lower your risk of getting this condition again:  Wash your hands often with soap and water. If you cannot  use soap and water, use hand sanitizer.  Avoid contact with people who have cold symptoms.  Try not to touch your hands to your mouth, nose, or eyes.  Make sure to get the flu shot every year.  Contact a doctor if:  Your symptoms do not get better in 2 weeks. Get help right away if:  You cough up blood.  You have chest pain.  You have very bad shortness of breath.  You become dehydrated.  You faint (pass out) or keep feeling like you are going to pass out.  You keep throwing up (vomiting).  You have a very bad headache.  Your fever or chills gets worse. This information is not intended to replace advice given to you by your health care provider. Make sure you discuss any questions you have with your health care provider. Document Released: 03/07/2008 Document Revised: 04/27/2016 Document Reviewed: 03/09/2016 Elsevier Interactive Patient Education  2017 [employer]  

## 2016-10-17 NOTE — Progress Notes (Signed)
Patient ID: Manuel Price, male   DOB: 05-02-1966, 51 y.o.   MRN: [id number] Manuel Price 51 y.o.   Chief Complaint  Patient presents with  . Cough  . Ear Pain  . Chest Pain    congestion     HISTORY OF PRESENT ILLNESS: This is a 51 y.o. male complaining of 2-3 week h/o cough and congestion. Has pleuritic chest pain and pain to midback area worse when he moves or takes a deep breath. Denies chest pain/SOB on exertion and denies cardiac Hx.  Cough  This is a new problem. The current episode started 1 to 4 weeks ago. The problem has been gradually worsening. The problem occurs constantly. The cough is productive of sputum. Associated symptoms include chest pain (states it's congestion), chills, postnasal drip, rhinorrhea and a sore throat. Pertinent negatives include no ear congestion, ear pain, eye redness, heartburn, myalgias, rash, shortness of breath, sweats or wheezing. The symptoms are aggravated by cold air. Risk factors for lung disease include travel ([place]). He has tried OTC cough suppressant (seen by MD in [place] and treated for allergies x3 days; given antibiotics x 3 days) for the symptoms. The treatment provided mild relief.     Prior to Admission medications   Medication Sig Start Date End Date Taking? Authorizing Provider  aspirin EC 81 MG tablet Take 81 mg by mouth daily.   Yes Historical Provider, MD  acetaminophen (TYLENOL) 500 MG tablet Take 500 mg by mouth every 6 (six) hours as needed. Reported on 01/05/2016    Historical Provider, MD  aspirin 325 MG tablet Take 325 mg by mouth. Reported on 01/05/2016    Historical Provider, MD  azithromycin (ZITHROMAX) 250 MG tablet Sig as indicated 10/17/16   [facility], MD  benzonatate (TESSALON) 200 MG capsule Take 1 capsule (200 mg total) by mouth 2 (two) times daily as needed for cough. 10/17/16   [name] December, MD    No Known Allergies  Patient Active Problem List   Diagnosis Date Noted  . Obstructive  sleep apnea 07/12/2013  . Venous stasis 04/30/2013  . Morbid obesity (HCC) 04/30/2013  . Vitamin D deficiency 03/20/2013    Past Medical History:  Diagnosis Date  . H/O blood clots 15 years +  . Sleep apnea     Past Surgical History:  Procedure Laterality Date  . KNEE ARTHROSCOPY Right   . KNEE SURGERY Right     Social History   Social History  . Marital status: Married    Spouse name: N/A  . Number of children: 3  . Years of education: N/A   Occupational History  . Corporate treasurer   Social History Main Topics  . Smoking status: Former Smoker    Packs/day: 0.20    Years: 3.00    Types: Cigarettes    Quit date: 07/12/1998  . Smokeless tobacco: Never Used  . Alcohol use No     Comment: rarely  . Drug use: No  . Sexual activity: Not on file   Other Topics Concern  . Not on file   Social History Narrative  . No narrative on file    Family History  Problem Relation Age of Onset  . Stroke Mother   . Emphysema Maternal Grandmother     non smoker     Review of Systems  Constitutional: Positive for chills.  HENT: Positive for congestion, postnasal drip, rhinorrhea and sore throat. Negative for ear pain and nosebleeds.   Eyes: Negative for  discharge and redness.  Respiratory: Positive for cough. Negative for shortness of breath and wheezing.   Cardiovascular: Positive for chest pain (states it's congestion). Negative for palpitations, orthopnea and PND.  Gastrointestinal: Negative for abdominal pain, heartburn, nausea and vomiting.  Genitourinary: Negative for dysuria and hematuria.  Musculoskeletal: Negative for myalgias.  Skin: Negative for rash.  Neurological: Negative.   Endo/Heme/Allergies: Negative.   Psychiatric/Behavioral: Negative.   All other systems reviewed and are negative.    Physical Exam  Constitutional: He is oriented to person, place, and time. He appears well-developed.  Morbidly obese  HENT:  Head: Normocephalic  and atraumatic.  Nose: Nose normal.  Mouth/Throat: No oropharyngeal exudate.  Eyes: Conjunctivae and EOM are normal. Pupils are equal, round, and reactive to light.  Neck: Normal range of motion. Neck supple. No thyromegaly present.  Cardiovascular: Normal rate, regular rhythm, normal heart sounds and intact distal pulses.   Pulmonary/Chest: Effort normal and breath sounds normal. He has no wheezes. He has no rales.  Abdominal: Soft. There is no tenderness.  Musculoskeletal: Normal range of motion.  Lymphadenopathy:    He has no cervical adenopathy.  Neurological: He is alert and oriented to person, place, and time. No sensory deficit. He exhibits normal muscle tone.  Skin: Capillary refill takes less than 2 seconds.  Psychiatric: He has a normal mood and affect. His behavior is normal.     ASSESSMENT & PLAN: Para MarchFrancisco was seen today for cough, ear pain and chest pain.  Diagnoses and all orders for this visit:  Acute bronchitis, unspecified organism  Cough  Chest congestion  Other orders -     azithromycin (ZITHROMAX) 250 MG tablet; Sig as indicated -     benzonatate (TESSALON) 200 MG capsule; Take 1 capsule (200 mg total) by mouth 2 (two) times daily as needed for cough.    Patient Instructions       IF you received an x-ray today, you will receive an [facility]. Please contact [facility] at [phone] with questions or concerns regarding your invoice.   IF you received labwork today, you will receive an invoice from [facility]. Please contact [facility] at [phone] with questions or concerns regarding your invoice.   Our billing staff will not be able to assist you with questions regarding bills from these companies.  You will be contacted with the lab results as soon as they are available. The fastest way to get your results is to activate your My Chart account. Instructions are located on the last page of this paperwork. If you have  not heard from us regarding the results in 2 weeks, please contact this office.      Acute Bronchitis, Adult Acute bronchitis is when air tubes (bronchi) in the lungs suddenly get swollen. The condition can make it hard to breathe. It can also cause these symptoms:  A cough.  Coughing up clear, yellow, or green mucus.  Wheezing.  Chest congestion.  Shortness of breath.  A fever.  Body aches.  Chills.  A sore throat. Follow these instructions at home: Medicines  Take over-the-counter and prescription medicines only as told by your doctor.  If you were prescribed an antibiotic medicine, take it as told by your doctor. Do not stop taking the antibiotic even if you start to feel better. General instructions  Rest.  Drink enough fluids to keep your pee (urine) clear or pale yellow.  Avoid smoking and secondhand smoke. If you smoke and you need help quitting, ask  your doctor. Quitting will help your lungs heal faster.  Use an inhaler, cool mist vaporizer, or humidifier as told by your doctor.  Keep all follow-up visits as told by your doctor. This is important. How is this prevented? To lower your risk of getting this condition again:  Wash your hands often with soap and water. If you cannot use soap and water, use hand sanitizer.  Avoid contact with people who have cold symptoms.  Try not to touch your hands to your mouth, nose, or eyes.  Make sure to get the flu shot every year. Contact a doctor if:  Your symptoms do not get better in 2 weeks. Get help right away if:  You cough up blood.  You have chest pain.  You have very bad shortness of breath.  You become dehydrated.  You faint (pass out) or keep feeling like you are going to pass out.  You keep throwing up (vomiting).  You have a very bad headache.  Your fever or chills gets worse. This information is not intended to replace advice given to you by your health care provider. Make sure you  discuss any questions you have with your health care provider. Document Released: 03/07/2008 Document Revised: 04/27/2016 Document Reviewed: 03/09/2016 Elsevier Interactive Patient Education  2017 Elsevier Inc.      Edwina Barth, MD [facility]

## 2016-12-15 ENCOUNTER — Encounter: Payer: Self-pay | Admitting: Internal Medicine

## 2016-12-15 ENCOUNTER — Ambulatory Visit (INDEPENDENT_AMBULATORY_CARE_PROVIDER_SITE_OTHER): Payer: Self-pay | Admitting: Internal Medicine

## 2016-12-15 VITALS — BP 126/82 | HR 75 | Ht 71.5 in | Wt 396.0 lb

## 2016-12-15 DIAGNOSIS — Z23 Encounter for immunization: Secondary | ICD-10-CM

## 2016-12-15 DIAGNOSIS — G4733 Obstructive sleep apnea (adult) (pediatric): Secondary | ICD-10-CM

## 2016-12-15 NOTE — Progress Notes (Signed)
HPI  male never smoker followed for OSA, complicated by morbid obesity, NPSG 05/15/13-[facility] severe obstructive sleep apnea- AHI 103.3/ hr, desat to 73%, weight 385 lbs  ----------------------------------------------------------------------------------- 12/15/14- 48 yoM never smoker-referred courtesy of Dr Thornton PapasJeffrey Anderson; followed for severe OSA FOLLOWS FOR: Wears CPAP 14 every night for about 6 hours; DME is [facility]. No supplies needed at this time. Will need to request DL as DL not in [facility].  12/15/2016-51 year old male never smoker followed for OSA, complicated by morbid obesity, CPAP 12/[facility]  >> today to auto 5-15 self pay                    Daughter is here as Nurse, learning disabilitytranslator if needed FOLLOWS FOR: Wears CPAP nightly. Denies problems with mask/pressure. DME: [facility] Weight 396 pounds Download confirms excellent compliance 98% 4 hours, excellent control AHI 1.0. He says he sleeps better with it and wife tells him he does not snore wearing CPAP. He also asks for pneumonia vaccine which we reviewed. He has never had one. Has just started phentermine from [facility] for weight loss  ROS-see HPI Constitutional:   No-   weight loss, night sweats, fevers, chills, fatigue, lassitude. HEENT:   No-  headaches, difficulty swallowing, tooth/dental problems, sore throat,       No-  sneezing, itching, ear ache, nasal congestion, post nasal drip,  CV:  No-   chest pain, orthopnea, PND, + chronic swelling in lower extremities, anasarca, dizziness, palpitations Resp: +shortness of breath with exertion or at rest.              No-   productive cough,  No non-productive cough,  No- coughing up of blood.              No-   change in color of mucus.  No- wheezing.   Skin: No-   rash or lesions. GI:  No-   heartburn, indigestion, abdominal pain, nausea, vomiting,  GU:  MS:  No-   joint pain or swelling.  Neuro-     nothing unusual Psych:  No- change in mood or affect. No depression or  anxiety.  No memory loss.  OBJ- Physical Exam General- Alert, Oriented, Affect-appropriate, Distress- none acute. +morbidly obese very thick neck Skin- rash-none, lesions- none, excoriation- none Lymphadenopathy- none Head- atraumatic            Eyes- Gross vision intact, PERRLA, conjunctivae and secretions clear            Ears- Hearing, canals-normal            Nose- Clear, no-Septal dev, mucus, polyps, erosion, perforation             Throat- Mallampati III , mucosa clear , drainage- none, +tonsils present Neck- flexible , trachea midline, no stridor , thyroid nl, carotid no bruit Chest - symmetrical excursion , unlabored           Heart/CV- RRR , no murmur , no gallop  , no rub, nl s1 s2                           - JVD- none , +edema+1-2 under elastic support hose , varices- none           Lung- clear to P&A, wheeze- none, cough- none , dullness-none, rub- none           Chest wall-  Abd-  Br/ Gen/ Rectal- Not done, not indicated Extrem-  cyanosis- none, clubbing, none, atrophy- none, strength- nl Neuro- grossly intact to observation

## 2016-12-15 NOTE — Assessment & Plan Note (Signed)
He definitely feels better with CPAP, confirmed by family, and download report shows excellent compliance and control. Current machine is about 53 years old. He does not have insurance so he self-pays. He asks to go ahead and get prescription for replacement machine and we discussed change to auto Pap. Plan - printed prescription for replacement CPAP machine changing to AutoPap 5-15

## 2016-12-15 NOTE — Assessment & Plan Note (Signed)
Now working with [facility], starting phentermine for weight loss

## 2016-12-15 NOTE — Patient Instructions (Addendum)
Print script for new CPAP machine of choice, auto 5-15, mask of choice, humidifier, supplies, with download card or [facility]     Dx OSA  Pneumovax 23 pneumonia  Vaccine  Please call if we can help

## 2017-03-08 ENCOUNTER — Ambulatory Visit (INDEPENDENT_AMBULATORY_CARE_PROVIDER_SITE_OTHER): Payer: Self-pay | Admitting: Emergency Medicine

## 2017-03-08 ENCOUNTER — Ambulatory Visit (HOSPITAL_COMMUNITY)
Admission: RE | Admit: 2017-03-08 | Discharge: 2017-03-08 | Disposition: A | Discharge status: Home / Self Care | Payer: Self-pay | Source: Ambulatory Visit | Attending: Emergency Medicine | Admitting: Emergency Medicine

## 2017-03-08 ENCOUNTER — Encounter: Payer: Self-pay | Admitting: Emergency Medicine

## 2017-03-08 VITALS — BP 126/85 | HR 87 | Temp 98.6°F | Resp 16 | Ht 70.5 in | Wt 380.6 lb

## 2017-03-08 DIAGNOSIS — M79605 Pain in left leg: Secondary | ICD-10-CM | POA: Insufficient documentation

## 2017-03-08 DIAGNOSIS — J3489 Other specified disorders of nose and nasal sinuses: Secondary | ICD-10-CM

## 2017-03-08 NOTE — Progress Notes (Signed)
Kathrin GreathouseFrancisco Kloster 51 y.o.   Chief Complaint  Patient presents with  . Cough    productive w/ white and green drainage x 3 days  . Knee Pain    x 2 months, left knee    HISTORY OF PRESENT ILLNESS: This is a 51 y.o. male complaining of pain to left leg on and off x 2 mos. Denies trauma. Also c/o nasal secretions at times purulent but mostly clear.  HPI   Prior to Admission medications   Medication Sig Start Date End Date Taking? Authorizing Provider  aspirin EC 81 MG tablet Take 81 mg by mouth daily.   Yes [provider]  phentermine 30 MG capsule Take 30 mg by mouth every morning.   Yes [provider]    No Known Allergies  Patient Active Problem List   Diagnosis Date Noted  . Obstructive sleep apnea 07/12/2013  . Venous stasis 04/30/2013  . Morbid obesity (HCC) 04/30/2013  . Vitamin D deficiency 03/20/2013    Past Medical History:  Diagnosis Date  . H/O blood clots 15 years +  . Sleep apnea     Past Surgical History:  Procedure Laterality Date  . KNEE ARTHROSCOPY Right   . KNEE SURGERY Right     Social History   Social History  . Marital status: Married    Spouse name: N/A  . Number of children: 3  . Years of education: N/A   Occupational History  . Corporate treasurerconstruction [employer]   Social History Main Topics  . Smoking status: Former Smoker    Packs/day: 0.20    Years: 3.00    Types: Cigarettes    Quit date: 07/12/1998  . Smokeless tobacco: Never Used  . Alcohol use No     Comment: rarely  . Drug use: No  . Sexual activity: Not on file   Other Topics Concern  . Not on file   Social History Narrative  . No narrative on file    Family History  Problem Relation Age of Onset  . Stroke Mother   . Emphysema Maternal Grandmother        non smoker     Review of Systems  Constitutional: Negative.  Negative for chills and fever.  HENT: Positive for congestion. Negative for ear pain, nosebleeds, sinus pain and sore  throat.   Eyes: Negative.  Negative for discharge and redness.  Respiratory: Positive for cough. Negative for shortness of breath.   Cardiovascular: Negative.  Negative for chest pain and palpitations.  Gastrointestinal: Negative for abdominal pain, diarrhea, nausea and vomiting.  Genitourinary: Negative for dysuria and hematuria.  Musculoskeletal: Negative for back pain, myalgias and neck pain.  Skin: Negative.  Negative for rash.  Neurological: Negative.  Negative for dizziness and headaches.  Endo/Heme/Allergies: Negative.   All other systems reviewed and are negative.  Vitals:   03/08/17 0840  BP: 126/85  Pulse: 87  Resp: 16  Temp: 98.6 F (37 C)     Physical Exam  Constitutional: He is oriented to person, place, and time. He appears well-developed.  Morbidly obese.  HENT:  Head: Normocephalic and atraumatic.  Nose: Nose normal.  Mouth/Throat: Oropharynx is clear and moist. No oropharyngeal exudate.  Eyes: EOM are normal. Pupils are equal, round, and reactive to light.  Neck: Normal range of motion. Neck supple. No JVD present. No thyromegaly present.  Cardiovascular: Normal rate, regular rhythm, normal heart sounds and intact distal pulses.   Pulmonary/Chest: Effort normal and breath sounds normal.  Abdominal: Soft. He exhibits no distension. There is no tenderness.  Musculoskeletal: Normal range of motion.  LLE: no erythema, swelling, or significant tenderness; NVI; states pain is mostly behind knee. FROM  Lymphadenopathy:    He has no cervical adenopathy.  Neurological: He is alert and oriented to person, place, and time. No sensory deficit. He exhibits normal muscle tone.  Skin: Skin is warm and dry. Capillary refill takes less than 2 seconds. No rash noted.  Psychiatric: He has a normal mood and affect. His behavior is normal.  Vitals reviewed.    ASSESSMENT & PLAN: Scheduled [facility] leg ultrasound [facility]. Foye was seen today for cough and knee  pain.  Diagnoses and all orders for this visit:  Left leg pain -     VAS [place] LOWER EXTREMITY VENOUS (DVT); Future  Increased nasal secretion   Patient Instructions   Go to [place] admitting to register this morning at 10:45 am for the ultrasound.   IF you received an x-ray today, you will receive an invoice from [facility]. Please contact [facility] at [phone] with questions or concerns regarding your invoice.   IF you received labwork today, you will receive an invoice from [place]. Please contact [facility] at [phone] with questions or concerns regarding your invoice.   Our billing staff will not be able to assist you with questions regarding bills from these companies.  You will be contacted with the lab results as soon as they are available. The fastest way to get your results is to activate your My Chart account. Instructions are located on the last page of this paperwork. If you have not heard from [place] regarding the results in 2 weeks, please contact this office.          Edwina Barth, MD [facility]

## 2017-03-08 NOTE — Patient Instructions (Addendum)
[facility] to register this morning at 10:45 am for the ultrasound.   IF you received an x-ray today, you will receive an invoice from [facility]. Please contact [facility] at [phone] with questions or concerns regarding your invoice.   IF you received labwork today, you will receive an invoice from [facility]. Please contact [facility] at [phone] with questions or concerns regarding your invoice.   Our billing staff will not be able to assist you with questions regarding bills from these companies.  You will be contacted with the lab results as soon as they are available. The fastest way to get your results is to activate your My Chart account. Instructions are located on the last page of this paperwork. If you have not heard from us regarding the results in 2 weeks, please contact this office.

## 2017-03-08 NOTE — Progress Notes (Signed)
VASCULAR LAB PRELIMINARY  PRELIMINARY  PRELIMINARY  PRELIMINARY  Left lower extremity venous duplex completed.    Preliminary report:  Left:  No evidence of DVT, superficial thrombosis, or Baker's cyst.  Trenisha Lafavor, RVS 03/08/2017, 11:36 AM

## 2017-07-14 ENCOUNTER — Encounter: Payer: Self-pay | Admitting: Emergency Medicine

## 2017-07-14 ENCOUNTER — Ambulatory Visit (INDEPENDENT_AMBULATORY_CARE_PROVIDER_SITE_OTHER): Payer: Self-pay | Admitting: Emergency Medicine

## 2017-07-14 ENCOUNTER — Other Ambulatory Visit: Payer: Self-pay | Admitting: *Deleted

## 2017-07-14 VITALS — BP 140/80 | HR 83 | Temp 98.6°F | Resp 16 | Ht 70.25 in | Wt 390.2 lb

## 2017-07-14 DIAGNOSIS — R0989 Other specified symptoms and signs involving the circulatory and respiratory systems: Secondary | ICD-10-CM

## 2017-07-14 DIAGNOSIS — J069 Acute upper respiratory infection, unspecified: Secondary | ICD-10-CM

## 2017-07-14 DIAGNOSIS — Z1211 Encounter for screening for malignant neoplasm of colon: Secondary | ICD-10-CM

## 2017-07-14 MED ORDER — PSEUDOEPHEDRINE-GUAIFENESIN ER 120-1200 MG PO TB12: 1.0000 | ORAL_TABLET | Freq: Two times a day (BID) | ORAL | 0 refills | Status: DC | PRN

## 2017-07-14 MED ORDER — TRIAMCINOLONE ACETONIDE 55 MCG/ACT NA AERO: 2.0000 | INHALATION_SPRAY | Freq: Every day | NASAL | 12 refills | Status: DC

## 2017-07-14 NOTE — Patient Instructions (Addendum)
IF you received an x-ray today, you will receive an invoice from [facility]. Please contact [facility] at [phone] with questions or concerns regarding your invoice.   IF you received labwork today, you will receive an invoice from [place]. Please contact [facility] at [phone] with questions or concerns regarding your invoice.   Our billing staff will not be able to assist you with questions regarding bills from these companies.  You will be contacted with the lab results as soon as they are available. The fastest way to get your results is to activate your My Chart account. Instructions are located on the last page of this paperwork. If you have not heard from [place] regarding the results in 2 weeks, please contact this office.     Infeccin del tracto respiratorio superior, adultos (Upper Respiratory Infection, Adult) La mayora de las infecciones del tracto respiratorio superior estn causadas por un virus. Un infeccin del tracto respiratorio superior afecta la nariz, la garganta y las vas respiratorias superiores. El tipo ms comn de infeccin del tracto respiratorio superior es el resfro comn. CUIDADOS EN EL HOGAR  Tome los medicamentos solamente como se lo haya indicado el mdico.  A fin de Engineer, materials de garganta, haga grgaras con solucin salina templada o consuma caramelos para la tos, como se lo haya indicado el mdico.  Use un humidificador de vapor clido o inhale el vapor de la ducha para aumentar la humedad del aire. Esto facilitar la respiracin.  Beba suficiente lquido para mantener el pis (orina) claro o de color amarillo plido.  Tome sopas y caldos transparentes.  Siga una dieta saludable.  Descanse todo lo que sea necesario.  Regrese al Aleen Campi cuando la fiebre haya desaparecido o el mdico le diga que puede Coleta. ? Es posible que deba quedarse en su casa durante un tiempo prolongado, para no transmitir la infeccin a  los dems. ? Tambin puede usar un barbijo y lavarse las manos con frecuencia para evitar el contagio del virus.  Si tiene asma, use el inhalador con mayor frecuencia.  No consuma ningn producto que contenga tabaco, lo que incluye cigarrillos, tabaco de Theatre manager o Administrator, Civil Service. Si necesita ayuda para dejar de fumar, consulte al mdico. SOLICITE AYUDA SI:  Siente que empeora o que no mejora.  Los medicamentos no logran [employer].  Tiene escalofros.  La dificultad para Clinical cytogeneticist.  Tiene mucosidad marrn o roja.  Tiene una secrecin amarilla o marrn de la Clinical cytogeneticist.  Le duele la cara, especialmente al inclinarse hacia adelante.  Tiene fiebre.  Tiene los ganglios del cuello hinchados.  Siente dolor al tragar.  Tiene zonas blancas en la parte de atrs de la garganta. SOLICITE AYUDA DE INMEDIATO SI:  Los siguientes sntomas son muy intensos o constantes: ? Dolor de [place]. ? Dolor de odos. ? Dolor en la frente, detrs de los ojos y por encima de los pmulos (dolor sinusal). ? Journalist, newspaper.  Tiene enfermedad pulmonar prolongada (crnica) y cualquiera de estos sntomas: ? Sibilancias. ? Tos prolongada. ? Tos con Montez Hageman. ? Cambio en la mucosidad habitual.  Presenta rigidez en el cuello.  Tiene cambios en: ? La visin. ? La audicin. ? El pensamiento. ? El Zinc de nimo. ASEGRESE DE QUE:  Comprende estas instrucciones.  Controlar su afeccin.  Recibir ayuda de inmediato si no mejora o si empeora. Esta informacin no tiene Theme park manager el consejo del mdico. Asegrese de hacerle al mdico cualquier  pregunta que tenga. Document Released: 02/21/2011 Document Revised: 02/03/2015 Document Reviewed: 12/25/2013 Elsevier Interactive Patient Education  2018 [employer].

## 2017-07-14 NOTE — Addendum Note (Signed)
Addended by: Georg Ruddle A on: 07/14/2017 05:25 PM   Modules accepted: Orders

## 2017-07-14 NOTE — Addendum Note (Signed)
Addended by: Georg Ruddle A on: 07/14/2017 06:04 PM   Modules accepted: Orders

## 2017-07-14 NOTE — Progress Notes (Signed)
Manuel Price 51 y.o.   Chief Complaint  Patient presents with  . Cough    yellow mucus x 7 days  . Nasal Congestion    HISTORY OF PRESENT ILLNESS: This is a 51 y.o. male complaining of URI symptoms x 1 week slowly getting better.  URI   This is a new problem. The current episode started in the past 7 days. The problem has been gradually improving. There has been no fever. Associated symptoms include congestion, coughing, rhinorrhea, sinus pain and a sore throat. Pertinent negatives include no abdominal pain, chest pain, diarrhea, dysuria, ear pain, headaches, joint pain, nausea, neck pain, rash, vomiting or wheezing. Treatments tried: NyQuil and DayQuil. The treatment provided mild relief.     Prior to Admission medications   Medication Sig Start Date End Date Taking? Authorizing Provider  aspirin EC 81 MG tablet Take 81 mg by mouth daily.   Yes [provider]  phentermine 30 MG capsule Take 30 mg by mouth every morning.    [provider]    No Known Allergies  Patient Active Problem List   Diagnosis Date Noted  . Left leg pain 03/08/2017  . Increased nasal secretion 03/08/2017  . Obstructive sleep apnea 07/12/2013  . Venous stasis 04/30/2013  . Morbid obesity (HCC) 04/30/2013  . Vitamin D deficiency 03/20/2013    Past Medical History:  Diagnosis Date  . H/O blood clots 15 years +  . Sleep apnea     Past Surgical History:  Procedure Laterality Date  . KNEE ARTHROSCOPY Right   . KNEE SURGERY Right     Social History   Social History  . Marital status: Married    Spouse name: N/A  . Number of children: 3  . Years of education: N/A   Occupational History  . Corporate treasurer   Social History Main Topics  . Smoking status: Former Smoker    Packs/day: 0.20    Years: 3.00    Types: Cigarettes    Quit date: 07/12/1998  . Smokeless tobacco: Never Used  . Alcohol use No     Comment: rarely  . Drug use: No  . Sexual  activity: Not on file   Other Topics Concern  . Not on file   Social History Narrative  . No narrative on file    Family History  Problem Relation Age of Onset  . Stroke Mother   . Emphysema Maternal Grandmother        non smoker     Review of Systems  Constitutional: Negative.  Negative for chills and fever.  HENT: Positive for congestion, rhinorrhea, sinus pain and sore throat. Negative for ear pain and nosebleeds.   Eyes: Negative for discharge and redness.  Respiratory: Positive for cough and sputum production. Negative for hemoptysis, shortness of breath and wheezing.   Cardiovascular: Negative for chest pain and palpitations.  Gastrointestinal: Negative for abdominal pain, blood in stool, diarrhea, melena, nausea and vomiting.  Genitourinary: Negative for dysuria.  Musculoskeletal: Negative for back pain, joint pain, myalgias and neck pain.  Skin: Negative for rash.  Neurological: Negative for dizziness, sensory change, focal weakness, loss of consciousness and headaches.  Endo/Heme/Allergies: Negative.   All other systems reviewed and are negative.    Vitals:   07/14/17 1602  BP: 140/80  Pulse: 83  Resp: 16  Temp: 98.6 F (37 C)  SpO2: 96%     Physical Exam  Constitutional: He is oriented to person, place, and time. He appears  well-developed.  Obese.  HENT:  Head: Normocephalic and atraumatic.  Right Ear: External ear normal.  Left Ear: External ear normal.  Nose: Nose normal.  Mouth/Throat: Oropharynx is clear and moist.  Eyes: Pupils are equal, round, and reactive to light. Conjunctivae and EOM are normal.  Neck: Normal range of motion. Neck supple. No JVD present. No thyromegaly present.  Cardiovascular: Normal rate, regular rhythm and normal heart sounds.   Pulmonary/Chest: Effort normal and breath sounds normal.  Abdominal: Soft. He exhibits no distension. There is no tenderness.  Musculoskeletal: Normal range of motion.  Lymphadenopathy:    He  has no cervical adenopathy.  Neurological: He is alert and oriented to person, place, and time.  Skin: Skin is warm and dry. Capillary refill takes less than 2 seconds.  Psychiatric: He has a normal mood and affect. His behavior is normal.  Vitals reviewed.    ASSESSMENT & PLAN: Kayle was seen today for cough and nasal congestion.  Diagnoses and all orders for this visit:  Acute upper respiratory infection -     Pseudoephedrine-Guaifenesin (MUCINEX D MAX STRENGTH) [phone] MG TB12; Take 1 tablet by mouth every 12 (twelve) hours as needed. -     triamcinolone (NASACORT) 55 MCG/ACT AERO nasal inhaler; Place 2 sprays into the nose daily.  Screening for colon cancer -     Ambulatory referral to Gastroenterology  Chest congestion -     Pseudoephedrine-Guaifenesin (MUCINEX D MAX STRENGTH) [phone] MG TB12; Take 1 tablet by mouth every 12 (twelve) hours as needed.    Patient Instructions       IF you received an x-ray today, you will receive an invoice from [facility]. Please contact [facility] at [phone] with questions or concerns regarding your invoice.   IF you received labwork today, you will receive an invoice from [place]. Please contact [facility] at [phone] with questions or concerns regarding your invoice.   Our billing staff will not be able to assist you with questions regarding bills from these companies.  You will be contacted with the lab results as soon as they are available. The fastest way to get your results is to activate your My Chart account. Instructions are located on the last page of this paperwork. If you have not heard from [place] regarding the results in 2 weeks, please contact this office.     Infeccin del tracto respiratorio superior, adultos (Upper Respiratory Infection, Adult) La mayora de las infecciones del tracto respiratorio superior estn causadas por un virus. Un infeccin del tracto respiratorio superior afecta  la nariz, la garganta y las vas respiratorias superiores. El tipo ms comn de infeccin del tracto respiratorio superior es el resfro comn. CUIDADOS EN EL HOGAR  Tome los medicamentos solamente como se lo haya indicado el mdico.  A fin de Engineer, materials de garganta, haga grgaras con solucin salina templada o consuma caramelos para la tos, como se lo haya indicado el mdico.  Use un humidificador de vapor clido o inhale el vapor de la ducha para aumentar la humedad del aire. Esto facilitar la respiracin.  Beba suficiente lquido para mantener el pis (orina) claro o de color amarillo plido.  Tome sopas y caldos transparentes.  Siga una dieta saludable.  Descanse todo lo que sea necesario.  Regrese al Aleen Campi cuando la fiebre haya desaparecido o el mdico le diga que puede [place]. ? Es posible que deba quedarse en su casa durante un tiempo prolongado, para no transmitir la infeccin a los dems. ?  Tambin puede usar un barbijo y lavarse las manos con frecuencia para evitar el contagio del virus.  Si tiene asma, use el inhalador con mayor frecuencia.  No consuma ningn producto que contenga tabaco, lo que incluye cigarrillos, tabaco de Theatre manager o Administrator, Civil Service. Si necesita ayuda para dejar de fumar, consulte al mdico. SOLICITE AYUDA SI:  Siente que empeora o que no mejora.  Los medicamentos no logran [employer].  Tiene escalofros.  La dificultad para Clinical cytogeneticist.  Tiene mucosidad marrn o roja.  Tiene una secrecin amarilla o marrn de la Clinical cytogeneticist.  Le duele la cara, especialmente al inclinarse hacia adelante.  Tiene fiebre.  Tiene los ganglios del cuello hinchados.  Siente dolor al tragar.  Tiene zonas blancas en la parte de atrs de la garganta. SOLICITE AYUDA DE INMEDIATO SI:  Los siguientes sntomas son muy intensos o constantes: ? Dolor de [place]. ? Dolor de odos. ? Dolor en la frente, detrs de los ojos y por encima de los  pmulos (dolor sinusal). ? Journalist, newspaper.  Tiene enfermedad pulmonar prolongada (crnica) y cualquiera de estos sntomas: ? Sibilancias. ? Tos prolongada. ? Tos con Montez Hageman. ? Cambio en la mucosidad habitual.  Presenta rigidez en el cuello.  Tiene cambios en: ? La visin. ? La audicin. ? El pensamiento. ? El Azure de nimo. ASEGRESE DE QUE:  Comprende estas instrucciones.  Controlar su afeccin.  Recibir ayuda de inmediato si no mejora o si empeora. Esta informacin no tiene Theme park manager el consejo del mdico. Asegrese de hacerle al mdico cualquier pregunta que tenga. Document Released: 02/21/2011 Document Revised: 02/03/2015 Document Reviewed: 12/25/2013 Elsevier Interactive Patient Education  2018 Elsevier Inc.      Edwina Barth, MD [facility]

## 2017-07-24 ENCOUNTER — Other Ambulatory Visit: Payer: Self-pay

## 2017-07-24 DIAGNOSIS — R0989 Other specified symptoms and signs involving the circulatory and respiratory systems: Secondary | ICD-10-CM

## 2017-07-24 DIAGNOSIS — J069 Acute upper respiratory infection, unspecified: Secondary | ICD-10-CM

## 2017-07-24 MED ORDER — PSEUDOEPHEDRINE-GUAIFENESIN ER 120-1200 MG PO TB12: 1.0000 | ORAL_TABLET | Freq: Two times a day (BID) | ORAL | 0 refills | Status: DC | PRN

## 2017-07-24 MED ORDER — TRIAMCINOLONE ACETONIDE 55 MCG/ACT NA AERO: 2.0000 | INHALATION_SPRAY | Freq: Every day | NASAL | 12 refills | Status: DC

## 2017-08-17 ENCOUNTER — Encounter: Payer: Self-pay | Admitting: Emergency Medicine

## 2017-10-10 ENCOUNTER — Ambulatory Visit: Payer: Self-pay | Admitting: Emergency Medicine

## 2017-10-10 ENCOUNTER — Encounter: Payer: Self-pay | Admitting: Emergency Medicine

## 2017-10-10 ENCOUNTER — Other Ambulatory Visit: Payer: Self-pay

## 2017-10-10 VITALS — BP 146/95 | HR 73 | Temp 98.7°F | Resp 16 | Ht 70.75 in | Wt >= 6400 oz

## 2017-10-10 DIAGNOSIS — H6691 Otitis media, unspecified, right ear: Secondary | ICD-10-CM

## 2017-10-10 DIAGNOSIS — Z8639 Personal history of other endocrine, nutritional and metabolic disease: Secondary | ICD-10-CM

## 2017-10-10 DIAGNOSIS — Z8679 Personal history of other diseases of the circulatory system: Secondary | ICD-10-CM

## 2017-10-10 DIAGNOSIS — H9203 Otalgia, bilateral: Secondary | ICD-10-CM

## 2017-10-10 MED ORDER — LISINOPRIL-HYDROCHLOROTHIAZIDE 10-12.5 MG PO TABS: 1.0000 | ORAL_TABLET | Freq: Every day | ORAL | 3 refills | Status: DC

## 2017-10-10 MED ORDER — HYDROCORTISONE-ACETIC ACID 1-2 % OT SOLN: 3.0000 [drp] | Freq: Three times a day (TID) | OTIC | 1 refills | Status: AC

## 2017-10-10 MED ORDER — AZITHROMYCIN 250 MG PO TABS: ORAL_TABLET | ORAL | 0 refills | Status: DC

## 2017-10-10 NOTE — Progress Notes (Signed)
Manuel Price 52 y.o.   Chief Complaint  Patient presents with  . Ear Pain    x 5 days  BOTH    BP Readings from Last 3 Encounters:  10/10/17 (!) 146/95  07/14/17 140/80  03/08/17 126/85     HISTORY OF PRESENT ILLNESS: This is a 52 y.o. male complaining of pain to both ears x 1 week; also has past h/o HTN, at one point taking Lisinopril/HCTZ; went to [place] recently and seen at health center; found to have high BP and started on Captopril (took it last yesterday).  HPI   Prior to Admission medications   Medication Sig Start Date End Date Taking? Authorizing Provider  aspirin EC 81 MG tablet Take 81 mg by mouth daily.   Yes [provider]  triamcinolone (NASACORT) 55 MCG/ACT AERO nasal inhaler Place 2 sprays into the nose daily. Patient not taking: Reported on 10/10/2017 07/24/17   Georgina Quint, MD    No Known Allergies  Patient Active Problem List   Diagnosis Date Noted  . Acute upper respiratory infection 07/14/2017  . Screening for colon cancer 07/14/2017  . Chest congestion 07/14/2017  . Increased nasal secretion 03/08/2017  . Obstructive sleep apnea 07/12/2013  . Venous stasis 04/30/2013  . Morbid obesity (HCC) 04/30/2013  . Vitamin D deficiency 03/20/2013    Past Medical History:  Diagnosis Date  . H/O blood clots 15 years +  . Sleep apnea     Past Surgical History:  Procedure Laterality Date  . KNEE ARTHROSCOPY Right   . KNEE SURGERY Right     Social History   Socioeconomic History  . Marital status: Married    Spouse name: Not on file  . Number of children: 3  . Years of education: Not on file  . Highest education level: Not on file  Social Needs  . Financial resource strain: Not on file  . Food insecurity - worry: Not on file  . Food insecurity - inability: Not on file  . Transportation needs - medical: Not on file  . Transportation needs - non-medical: Not on file  Occupational History  . Occupation: Industrial/product designer: Physicist, medical  Tobacco Use  . Smoking status: Former Smoker    Packs/day: 0.20    Years: 3.00    Pack years: 0.60    Types: Cigarettes    Last attempt to quit: 07/12/1998    Years since quitting: 19.2  . Smokeless tobacco: Never Used  Substance and Sexual Activity  . Alcohol use: No    Comment: rarely  . Drug use: No  . Sexual activity: Not on file  Other Topics Concern  . Not on file  Social History Narrative  . Not on file    Family History  Problem Relation Age of Onset  . Stroke Mother   . Emphysema Maternal Grandmother        non smoker     Review of Systems  Constitutional: Negative.  Negative for chills and fever.  HENT: Positive for ear pain. Negative for sore throat.   Eyes: Negative for discharge and redness.  Respiratory: Negative.  Negative for cough and shortness of breath.   Cardiovascular: Negative.  Negative for chest pain and palpitations.  Gastrointestinal: Negative for abdominal pain, diarrhea, nausea and vomiting.  Genitourinary: Negative.   Musculoskeletal: Negative.   Neurological: Negative.  Negative for dizziness and headaches.  Endo/Heme/Allergies: Negative.   All other systems reviewed and are negative.   Vitals:  10/10/17 1531  BP: (!) 146/95  Pulse: 73  Resp: 16  Temp: 98.7 F (37.1 C)  SpO2: 96%    Physical Exam  Constitutional: He is oriented to person, place, and time. He appears well-developed.  Morbidly obese.  HENT:  Head: Normocephalic and atraumatic.  Right Ear: Ear canal normal. Tympanic membrane is scarred and erythematous.  Left Ear: Tympanic membrane, external ear and ear canal normal.  Eyes: Conjunctivae and EOM are normal. Pupils are equal, round, and reactive to light.  Neck: Normal range of motion. Neck supple. No JVD present. No thyromegaly present.  Cardiovascular: Normal rate, regular rhythm and normal heart sounds.  Pulmonary/Chest: Effort normal and breath sounds normal.  Abdominal: Soft.  He exhibits no distension. There is no tenderness.  Musculoskeletal: Normal range of motion.  Lymphadenopathy:    He has no cervical adenopathy.  Neurological: He is alert and oriented to person, place, and time. No sensory deficit. He exhibits normal muscle tone.  Skin: Skin is warm and dry. Capillary refill takes less than 2 seconds. No rash noted.  Psychiatric: He has a normal mood and affect. His behavior is normal.  Vitals reviewed.    ASSESSMENT & PLAN:  [name] was seen today for ear pain.  Diagnoses and all orders for this visit:  Acute otalgia, bilateral -     acetic acid-hydrocortisone (VOSOL-HC) OTIC solution; Place 3 drops into the right ear 3 (three) times daily for 7 days.  Acute right otitis media -     azithromycin (ZITHROMAX) 250 MG tablet; Sig as indicated  History of hypertension -     lisinopril-hydrochlorothiazide (PRINZIDE,ZESTORETIC) 10-12.5 MG tablet; Take 1 tablet by mouth daily. -     CBC with Differential/Platelet -     Comprehensive metabolic panel  History of hyperglycemia -     Hemoglobin A1c    Patient Instructions       IF you received an x-ray today, you will receive an invoice from [facility]. Please contact [facility] at [phone] with questions or concerns regarding your invoice.   IF you received labwork today, you will receive an invoice from [place]. Please contact [facility] at [phone] with questions or concerns regarding your invoice.   Our billing staff will not be able to assist you with questions regarding bills from these companies.  You will be contacted with the lab results as soon as they are available. The fastest way to get your results is to activate your My Chart account. Instructions are located on the last page of this paperwork. If you have not heard from [place] regarding the results in 2 weeks, please contact this office.     Hipertensin Hypertension El trmino hipertensin es otra  forma de denominar a la presin arterial elevada. La presin arterial elevada fuerza al corazn a trabajar ms para bombear la sangre. Esto puede causar problemas con el paso del [place]. Una lectura de presin arterial est compuesta por 2 nmeros. Hay un nmero superior (sistlico) sobre un nmero inferior (diastlico). Lo ideal es tener la presin arterial por debajo de 120/80. Las decisiones saludables pueden ayudarle a disminuir su presin arterial. Es posible que necesite medicamentos que le ayuden a disminuir su presin arterial si:  Su presin arterial no disminuye mediante decisiones saludables.  Su presin arterial est por encima de 130/80.  Siga estas instrucciones en su casa: Comida y bebida  Si se lo indican, siga el plan de alimentacin de DASH (Dietary Approaches to Stop Hypertension, Maneras de alimentarse  para detener la hipertensin). Esta dieta incluye: ? Que la mitad del plato de cada comida sea de frutas y verduras. ? Que un cuarto del plato de cada comida sea de cereales integrales. Los cereales integrales incluyen pasta integral, arroz integral y pan integral. ? Comer y beber productos lcteos con bajo contenido de [place]gr[place], como leche descremada o yogur bajo en grasas. ? Que un cuarto del plato de cada comida sea de protenas bajas en grasa (magras). Las protenas bajas en grasa incluyen pescado, pollo sin piel, huevos, frijoles y tofu. ? Evitar consumir carne grasa, carne curada y procesada, o pollo con piel. ? Evitar consumir alimentos prehechos o procesados.  Consuma menos de 1500 mg de sal (sodio) por da.  Limite el consumo de alcohol a no ms de 1 medida por da si es mujer y no est Orthoptistembarazada y a 2 medidas por da si es hombre. Una medida equivale a 12onzas de cerveza, 5onzas de vino o 1onzas de bebidas alcohlicas de alta graduacin. Estilo de vida  Trabaje con su mdico para mantenerse en un peso saludable o para perder peso. Pregntele a su mdico cul es el  peso recomendable para usted.  Realice al menos 30 minutos de ejercicio que haga que se acelere su corazn (ejercicio Magazine features editoraerbico) la DIRECTVmayora de los das de la [place]. Estos pueden incluir caminar, nadar o andar en bicicleta.  Realice al menos 30 minutos de ejercicio que fortalezca sus msculos (ejercicios de resistencia) al menos 3 das a la Weatoguesemana. Estos pueden incluir levantar pesas o hacer pilates.  No consuma ningn producto que contenga nicotina o tabaco. Esto incluye cigarrillos y cigarrillos electrnicos. Si necesita ayuda para dejar de fumar, consulte al American Expressmdico.  Controle su presin arterial en su casa tal como le indic el mdico.  Concurra a todas las visitas de control como se lo haya indicado el mdico. Esto es importante. Medicamentos  Baxter Internationalome los medicamentos de venta libre y los recetados solamente como se lo haya indicado el mdico. Siga cuidadosamente las indicaciones.  No omita las dosis de medicamentos para la presin arterial. Los medicamentos pierden eficacia si omite dosis. El hecho de omitir las dosis tambin [place]umenta el riesgo de otros problemas.  Pregntele a su mdico a qu efectos secundarios o reacciones a los Museum/gallery curatormedicamentos debe prestar atencin. Comunquese con un mdico si:  Piensa que tiene [place] reaccin a los medicamentos que est tomando.  Tiene dolores de cabeza frecuentes (recurrentes).  Siente mareos.  Tiene hinchazn en los tobillos.  Tiene problemas de visin. Solicite ayuda de inmediato si:  Siente un dolor de cabeza muy intenso.  Comienza a sentirse confundido.  Se siente dbil o adormecido.  Siente que va a desmayarse.  Siente un dolor muy intenso en: ? El pecho. ? El vientre (abdomen).  Devuelve (vomita) ms de una vez.  Tiene dificultad para respirar. Resumen  El trmino hipertensin es otra forma de denominar a la presin arterial elevada.  Las decisiones saludables pueden ayudarle a disminuir su presin arterial. Si no puede  controlar su presin arterial mediante decisiones saludables, es posible que deba tomar medicamentos. Esta informacin no tiene Theme park managercomo fin reemplazar el consejo del mdico. Asegrese de hacerle al mdico cualquier pregunta que tenga. Document Released: 03/09/2010 Document Revised: 08/31/2016 Document Reviewed: 08/31/2016 Elsevier Interactive Patient Education  2018 [employer]vier Inc.  Otitis media - Adultos (Otitis Media, Adult) La otitis media es la irritacin, dolor e inflamacin (hinchazn) en el espacio que se encuentra detrs del tmpano (odo medio). La  causa puede ser [place] o una infeccin. Generalmente aparece junto con un resfro. CUIDADOS EN EL HOGAR  Tome los medicamentos segn las indicaciones. Finalice la prescripcin completa, aunque se sienta mejor.  Solo tome medicamentos de venta libre o recetados para Chief Technology Officer, Dentist o fiebre, como le indique el mdico.  [place] a las consultas de control con el mdico, segn las indicaciones.  SOLICITE AYUDA SI:  Tiene otitis media slo en un odo o sangra por la nariz, o ambas cosas.  Advierte un bulto en el cuello.  No mejora luego de 3-5 das.  Empeora en lugar de mejorar.  SOLICITE AYUDA DE INMEDIATO SI:  Siente un dolor intenso y no lo [employer].  Tiene irritacin, hinchazn o dolor en el odo.  Presenta rigidez en el cuello.  No puede mover una parte de su rostro (parlisis).  Nota que el hueso que se encuentra detrs de su oreja le duele al tocarlo.  ASEGRESE DE QUE:  Comprende estas instrucciones.  Controlar su afeccin.  Recibir ayuda de inmediato si no mejora o si empeora.  Esta informacin no tiene Theme park manager el consejo del mdico. Asegrese de hacerle al mdico cualquier pregunta que tenga. Document Released: 10/22/2010 Document Revised: 10/10/2014 Document Reviewed: 04/16/2013 Elsevier Interactive Patient Education  2017 Elsevier Inc.      Edwina Barth, MD [facility]

## 2017-10-10 NOTE — Progress Notes (Signed)
e

## 2017-10-10 NOTE — Patient Instructions (Addendum)
   IF you received an x-ray today, you will receive an invoice from [facility]. Please contact [facility] at [phone] with questions or concerns regarding your invoice.   IF you received labwork today, you will receive an invoice from [facility]. Please contact [facility] at [phone] with questions or concerns regarding your invoice.   Our billing staff will not be able to assist you with questions regarding bills from these companies.  You will be contacted with the lab results as soon as they are available. The fastest way to get your results is to activate your My Chart account. Instructions are located on the last page of this paperwork. If you have not heard from us regarding the results in 2 weeks, please contact this office.     Hipertensin Hypertension El trmino hipertensin es otra forma de denominar a la presin arterial elevada. La presin arterial elevada fuerza al corazn a trabajar ms para bombear la sangre. Esto puede causar problemas con el paso del tiempo. Una lectura de presin arterial est compuesta por 2 nmeros. Hay un nmero superior (sistlico) sobre un nmero inferior (diastlico). Lo ideal es tener la presin arterial por debajo de 120/80. Las decisiones saludables pueden ayudarle a disminuir su presin arterial. Es posible que necesite medicamentos que le ayuden a disminuir su presin arterial si:  Su presin arterial no disminuye mediante decisiones saludables.  Su presin arterial est por encima de 130/80.  Siga estas instrucciones en su casa: Comida y bebida  Si se lo indican, siga el plan de alimentacin de DASH (Dietary Approaches to Stop Hypertension, Maneras de alimentarse para detener la hipertensin). Esta dieta incluye: ? Que la mitad del plato de cada comida sea de frutas y verduras. ? Que un cuarto del plato de cada comida sea de cereales integrales. Los cereales integrales incluyen pasta integral, arroz integral y pan  integral. ? Comer y beber productos lcteos con bajo contenido de grasa, como leche descremada o yogur bajo en grasas. ? Que un cuarto del plato de cada comida sea de protenas bajas en grasa (magras). Las protenas bajas en grasa incluyen pescado, pollo sin piel, huevos, frijoles y tofu. ? Evitar consumir carne grasa, carne curada y procesada, o pollo con piel. ? Evitar consumir alimentos prehechos o procesados.  Consuma menos de 1500 mg de sal (sodio) por da.  Limite el consumo de alcohol a no ms de 1 medida por da si es mujer y no est embarazada y a 2 medidas por da si es hombre. Una medida equivale a 12onzas de cerveza, 5onzas de vino o 1onzas de bebidas alcohlicas de alta graduacin. Estilo de vida  Trabaje con su mdico para mantenerse en un peso saludable o para perder peso. Pregntele a su mdico cul es el peso recomendable para usted.  Realice al menos 30 minutos de ejercicio que haga que se acelere su corazn (ejercicio aerbico) la mayora de los das de la semana. Estos pueden incluir caminar, nadar o andar en bicicleta.  Realice al menos 30 minutos de ejercicio que fortalezca sus msculos (ejercicios de resistencia) al menos 3 das a la semana. Estos pueden incluir levantar pesas o hacer pilates.  No consuma ningn producto que contenga nicotina o tabaco. Esto incluye cigarrillos y cigarrillos electrnicos. Si necesita ayuda para dejar de fumar, consulte al mdico.  Controle su presin arterial en su casa tal como le indic el mdico.  Concurra a todas las visitas de control como se lo haya indicado el mdico. Esto es   importante. Medicamentos  Baxter Internationalome los medicamentos de venta libre y los recetados solamente como se lo haya indicado el mdico. Siga cuidadosamente las indicaciones.  No omita las dosis de medicamentos para la presin arterial. Los medicamentos pierden eficacia si omite dosis. El hecho de omitir las dosis tambin Lesothoaumenta el riesgo de otros  problemas.  Pregntele a su mdico a qu efectos secundarios o reacciones a los Museum/gallery curatormedicamentos debe prestar atencin. Comunquese con un mdico si:  Piensa que tiene [place] reaccin a los medicamentos que est tomando.  Tiene dolores de cabeza frecuentes (recurrentes).  Siente mareos.  Tiene hinchazn en los tobillos.  Tiene problemas de visin. Solicite ayuda de inmediato si:  Siente un dolor de cabeza muy intenso.  Comienza a sentirse confundido.  Se siente dbil o adormecido.  Siente que va a desmayarse.  Siente un dolor muy intenso en: ? El pecho. ? El vientre (abdomen).  Devuelve (vomita) ms de una vez.  Tiene dificultad para respirar. Resumen  El trmino hipertensin es otra forma de denominar a la presin arterial elevada.  Las decisiones saludables pueden ayudarle a disminuir su presin arterial. Si no puede controlar su presin arterial mediante decisiones saludables, es posible que deba tomar medicamentos. Esta informacin no tiene Theme park managercomo fin reemplazar el consejo del mdico. Asegrese de hacerle al mdico cualquier pregunta que tenga. Document Released: 03/09/2010 Document Revised: 08/31/2016 Document Reviewed: 08/31/2016 Elsevier Interactive Patient Education  2018 [employer]  Otitis media - Adultos (Otitis Media, Adult) La otitis media es la irritacin, dolor e inflamacin (hinchazn) en el espacio que se encuentra detrs del tmpano (odo medio). La causa puede ser Vella Raringuna alergia o una infeccin. Generalmente aparece junto con un resfro. CUIDADOS EN EL HOGAR  Tome los medicamentos segn las indicaciones. Finalice la prescripcin completa, aunque se sienta mejor.  Solo tome medicamentos de venta libre o recetados para Chief Technology Officerel dolor, Dentistmalestar o fiebre, como le indique el mdico.  Seven Springsoncurra a las consultas de control con el mdico, segn las indicaciones.  SOLICITE AYUDA SI:  Tiene otitis media slo en un odo o sangra por la nariz, o ambas cosas.  Advierte un  bulto en el cuello.  No mejora luego de 3-5 das.  Empeora en lugar de mejorar.  SOLICITE AYUDA DE INMEDIATO SI:  Siente un dolor intenso y no lo Engelhard Corporationalivian los medicamentos.  Tiene irritacin, hinchazn o dolor en el odo.  Presenta rigidez en el cuello.  No puede mover una parte de su rostro (parlisis).  Nota que el hueso que se encuentra detrs de su oreja le duele al tocarlo.  ASEGRESE DE QUE:  Comprende estas instrucciones.  Controlar su afeccin.  Recibir ayuda de inmediato si no mejora o si empeora.  Esta informacin no tiene Theme park managercomo fin reemplazar el consejo del mdico. Asegrese de hacerle al mdico cualquier pregunta que tenga. Document Released: 10/22/2010 Document Revised: 10/10/2014 Document Reviewed: 04/16/2013 Elsevier Interactive Patient Education  2017 [employer]vier [employer]

## 2017-10-11 LAB — COMPREHENSIVE METABOLIC PANEL
A/G RATIO: 1.6 (ref 1.2–2.2)
ALT: 61 IU/L — ABNORMAL HIGH (ref 0–44)
AST: 36 IU/L (ref 0–40)
Albumin: 4.1 g/dL (ref 3.5–5.5)
Alkaline Phosphatase: 60 IU/L (ref 39–117)
BILIRUBIN TOTAL: 0.4 mg/dL (ref 0.0–1.2)
BUN/Creatinine Ratio: 14 (ref 9–20)
BUN: 14 mg/dL (ref 6–24)
CALCIUM: 9 mg/dL (ref 8.7–10.2)
CHLORIDE: 105 mmol/L (ref 96–106)
CO2: 22 mmol/L (ref 20–29)
Creatinine, Ser: 0.99 mg/dL (ref 0.76–1.27)
GFR calc Af Amer: 100 mL/min/{1.73_m2} (ref >59)
GFR calc non Af Amer: 87 mL/min/{1.73_m2} (ref >59)
GLUCOSE: 99 mg/dL (ref 65–99)
Globulin, Total: 2.5 g/dL (ref 1.5–4.5)
POTASSIUM: 4.3 mmol/L (ref 3.5–5.2)
Sodium: 140 mmol/L (ref 134–144)
TOTAL PROTEIN: 6.6 g/dL (ref 6.0–8.5)

## 2017-10-11 LAB — CBC WITH DIFFERENTIAL/PLATELET
BASOS ABS: 0 10*3/uL (ref 0.0–0.2)
BASOS: 1 %
EOS (ABSOLUTE): 0.2 10*3/uL (ref 0.0–0.4)
Eos: 3 %
Hematocrit: 40.2 % (ref 37.5–51.0)
Hemoglobin: 14 g/dL (ref 13.0–17.7)
IMMATURE GRANS (ABS): 0 10*3/uL (ref 0.0–0.1)
IMMATURE GRANULOCYTES: 0 %
LYMPHS: 44 %
Lymphocytes Absolute: 3.1 10*3/uL (ref 0.7–3.1)
MCH: 31.1 pg (ref 26.6–33.0)
MCHC: 34.8 g/dL (ref 31.5–35.7)
MCV: 89 fL (ref 79–97)
MONOS ABS: 0.6 10*3/uL (ref 0.1–0.9)
Monocytes: 9 %
NEUTROS PCT: 43 %
Neutrophils Absolute: 3.2 10*3/uL (ref 1.4–7.0)
PLATELETS: 229 10*3/uL (ref 150–379)
RBC: 4.5 x10E6/uL (ref 4.14–5.80)
RDW: 13 % (ref 12.3–15.4)
WBC: 7.2 10*3/uL (ref 3.4–10.8)

## 2017-10-11 LAB — HEMOGLOBIN A1C
ESTIMATED AVERAGE GLUCOSE: 117 mg/dL
HEMOGLOBIN A1C: 5.7 % — AB (ref 4.8–5.6)

## 2017-10-12 ENCOUNTER — Encounter: Payer: Self-pay | Admitting: Radiology

## 2017-10-19 ENCOUNTER — Encounter: Payer: Self-pay | Admitting: Internal Medicine

## 2017-11-10 ENCOUNTER — Ambulatory Visit: Payer: Self-pay | Admitting: Emergency Medicine

## 2017-11-10 ENCOUNTER — Encounter: Payer: Self-pay | Admitting: Emergency Medicine

## 2017-11-10 VITALS — BP 138/82 | HR 67 | Temp 98.2°F | Resp 17 | Ht 71.5 in | Wt 399.0 lb

## 2017-11-10 DIAGNOSIS — Z8679 Personal history of other diseases of the circulatory system: Secondary | ICD-10-CM

## 2017-11-10 DIAGNOSIS — Z6841 Body Mass Index (BMI) 40.0 and over, adult: Secondary | ICD-10-CM

## 2017-11-10 DIAGNOSIS — N471 Phimosis: Secondary | ICD-10-CM | POA: Insufficient documentation

## 2017-11-10 DIAGNOSIS — H9203 Otalgia, bilateral: Secondary | ICD-10-CM

## 2017-11-10 NOTE — Patient Instructions (Addendum)
IF you received an x-ray today, you will receive an invoice from [facility]. Please contact [facility] at [phone] with questions or concerns regarding your invoice.   IF you received labwork today, you will receive an invoice from [facility]. Please contact [facility] at [phone] with questions or concerns regarding your invoice.   Our billing staff will not be able to assist you with questions regarding bills from these companies.  You will be contacted with the lab results as soon as they are available. The fastest way to get your results is to activate your My Chart account. Instructions are located on the last page of this paperwork. If you have not heard from [place] regarding the results in 2 weeks, please contact this office.       Recuento de caloras para bajar de peso Calorie Counting for Edison International Loss Las caloras son unidades de Engineer, drilling. Su cuerpo necesita una cierta cantidad de caloras de los alimentos para que le ayuden a Visual merchandiser. Cuando come ms caloras de las que el cuerpo necesita, este acumula las caloras extra [place]. Cuando come Nationwide Mutual Insurance de las que el cuerpo [place], este quema grasa para obtener la energa que requiere. El recuento de caloras es el registro de la cantidad de caloras que come y Investment banker, operational. El recuento de caloras puede ser de ayuda si necesita perder peso. Si se asegura de comer menos caloras de las que el cuerpo necesita, debe bajar de [place]. Pregntele al mdico cul es un peso sano para usted. Para que el recuento de caloras funcione, tendr que comer la cantidad de caloras adecuadas para usted en un da, para bajar una cantidad de peso saludable por semana. Un nutricionista puede determinar la cantidad de caloras que necesita por da y sugerirle cmo alcanzar su objetivo calrico.  Una cantidad de peso saludable para bajar por semana suele ser [place] 1 y Enedina Finner (0,5 a 0,9kg). Esto significa con  frecuencia que su ingesta diaria de caloras se debera reducir unas 500 a 750caloras.  Ingerir 1200 a 1500caloras por Clinical research associate a la [employer]ley[employer]son de las mujeres a Curator.  Ingerir de 1500 a 1800caloras por Clinical research associate a la [employer]ley[employer]son de los hombres a Curator.  En qu consiste el plan? Mi objetivo es comer __________ Garry Heater da. Si como esta cantidad de caloras por da, debo bajar unas __________ Albertine Grates. Qu debo saber acerca del recuento de caloras? A fin de alcanzar su objetivo diario de caloras, tendr que:  Averiguar cuntas caloras hay en cada alimento que le Lobbyist. Intente hacerlo antes de comer.  Decida la cantidad que puede comer del alimento.  Anote lo que comi y cuntas caloras tena. Esta tarea se conoce como llevar un registro de comidas.  Para perder peso con xito es importante equilibrar el recuento de caloras con un estilo de vida saludable que incluya actividad fsica de forma regular. Tenga un objetivo de de ejercicio moderado (como caminar) o 75 minutos de ejercicio vigoroso (como correr) todas las semanas. Dnde encuentro informacin sobre las caloras?  Es posible Veterinary surgeon cantidad de caloras que contiene un alimento en la etiqueta de informacin nutricional. Si un alimento no tiene una etiqueta de informacin nutricional, intente buscar las caloras en Internet o pida ayuda al nutricionista. Recuerde que las caloras se calculan por porcin. Si opta por comer ms de una porcin de un alimento, tendr Cisco caloras por porcin  por la cantidad de porciones que planea comer. Por ejemplo, la etiqueta de un envase de pan puede decir que el tamao de una porcin es [place], y que una porcin tiene 90caloras. Si come 1rodaja, habr comido 90caloras. Si come 2rodajas, habr comido 180caloras. Cmo llevo un registro de comidas? Despus de cada comida, registre la siguiente informacin  en el registro de comidas:  Lo que comi. No olvide incluir los aderezos, las salsas y otros extras de la comida.  La cantidad que comi. Esto se puede medir en tazas, onzas o cantidad de alimentos.  Cuntas caloras ingiri por comida y por bebida.  La cantidad total de caloras en la comida.  Tenga a Scientific laboratory technician de comidas, por ejemplo, en un anotador de bolsillo o utilice una aplicacin mvil o sitio web. Algunos programas calcularn las caloras y Insurance account manager la cantidad de caloras que le quedan para llegar al objetivo diario. Cules son algunos consejos para el recuento de caloras?  Use las caloras de los alimentos y las bebidas que lo sacien y no lo dejen con apetito: ? Algunos ejemplos de alimentos que lo sacian son los frutos secos y Civil engineer, contracting de frutos secos, verduras, Associate Professor y Forensic scientist con alto contenido de Research scientist (life sciences) como los cereales integrales. Los alimentos con alto contenido de [place] son aquellos que tienen ms de 5g de fibra por porcin. ? Las NiSource refrescos, especialmente las bebidas a base de caf y los jugos, que contienen muchas caloras, pero no le dan saciedad.  Coma alimentos nutritivos y evite las caloras vacas. Las caloras vacas son aquellas que se obtienen de los alimentos o las bebidas que no contienen muchos nutrientes ni protenas, como los dulces y los refrescos. Es mejor comer una comida nutritiva altamente calrica (como un aguacate) que una con pocos nutrientes (como una bolsa de patatas fritas).  Sepa cuntas caloras tienen los alimentos que come con ms frecuencia. Esto le ayudar a contar las caloras ms rpidamente.  Preste atencin a las Limited Brands. Las bebidas de bajas caloras incluyen agua y refrescos sin International aid/development worker.  Preste atencin a las etiquetas nutricionales de alimentos "bajos en grasas" o "sin grasas". Estos alimentos a veces tienen la misma cantidad de caloras o ms caloras que las versiones ricas  en grasa. Con frecuencia, tambin tienen agregados de azcar, almidn o sal, para darles el sabor que fue eliminado con la grasa.  Encuentre un mtodo para controlar las caloras que funcione para usted. Sea creativo. Pruebe aplicaciones o programas distintos, si llevar un registro de las caloras no funciona para usted. Cules son algunos consejos para controlar las porciones?  Sepa cuntas caloras hay en una porcin. Esto lo ayudar a saber cuntas porciones de un alimento determinado puede comer.  Use una taza medidora para medir los tamaos de las porciones. Tambin Hydrographic surveyor las porciones en una balanza de cocina. Con el tiempo, podr hacer un clculo estimativo de los tamaos de las porciones de algunos alimentos.  Dedique tiempo a poner porciones de diferentes alimentos en sus platos, tazones y tazas predilectos, a fin de saber cmo se ve una porcin.  Intente no comer directamente de una bolsa o una caja. Esto puede llevarlo a comer en exceso. Ponga la cantidad [employer] gustara comer en una taza o un plato, a fin de asegurarse de que est comiendo la porcin correcta.  Use platos, vasos y tazones ms pequeos para no comer en exceso.  Intente no realizar varias tareas  al mismo tiempo (como mirar la TV o usar su computadora) [place]mientras come. Si es la hora de comer, sintese a Museum/gallery conservatorla mesa y disfrute de Chemical engineerla comida. Esto lo ayudar a Public house managerreconocer cundo est satisfecho. Tambin le ayudar a tomar conciencia de lo que est comiendo y de la cantidad. Cules son algunos consejos para seguir este plan? Lectura de las etiquetas de los alimentos  Controle el recuento de caloras en comparacin con el tamao de la porcin. El tamao de la porcin puede ser ms pequeo de lo que suele comer.  Verifique la fuente de las caloras. Asegrese de que la comida que ingiere tenga alto contenido de vitaminas y protenas y sea baja en grasas saturadas y grasas trans. De compras  Lea las etiquetas  nutricionales cuando compre. Esto le ayudar a tomar decisiones ms saludables antes de comprar Standard Pacificuna comida.  Haga una lista para el almacn y resptela. La coccin  Intente cocinar sus alimentos preferidos de una manera ms saludable. Por ejemplo, pruebe hornear en vez de frer.  Utilice productos lcteos descremados. Planificacin de los alimentos  Utilice ms frutas y verduras. La mitad de sus platos debe ser de frutas y verduras.  Incluya protenas Group 1 Automotivemagras como el pollo y el pescado. Cmo puedo hacer el recuento de caloras cuando como afuera?  Pida porciones ms pequeas.  Considere la posibilidad de Agricultural consultantcompartir un plato principal y las guarniciones, en lugar de pedir su propio plato principal.  Si pide su propio plato principal, coma solo la mitad. Pida una caja al comienzo de la comida y ponga all el resto del plato principal, para no sentir la tentacin de comerlo.  Si se detallan las caloras en el men, elija las opciones que contengan la menor cantidad.  Elija platos que incluyan verduras, frutas, cereales integrales, productos lcteos con bajo contenido de grasa y Associate Professorprotenas magras.  Opte por los alimentos hervidos, asados, cocidos a la parrilla o al vapor. No coma alimentos que contengan mantequilla, estn empanados, fritos o que se sirvan con salsa a base de crema. Generalmente, los alimentos que se etiquetan como "crujientes" estn fritos, a menos que se indique lo contrario.  Elija el agua, la Cokeburgleche descremada, Oregonel t helado sin azcar u otras bebidas que no contengan azcares agregados. Si desea una bebida alcohlica, escoja una opcin con menos caloras como una copa de vino o una cerveza ligera.  Ordene los Pathmark Storesaderezos, las salsas y los jarabes aparte. Estos son, con frecuencia, de alto contenido en caloras, por lo que debe limitar la cantidad que ingiere.  Si desea Canary Brimuna ensalada, elija una de hortalizas y pida carnes a la parrilla. Evite las guarniciones adicionales como  el tocino, el queso o los alimentos fritos. Ordene el aderezo aparte o pida aceite de Oronogooliva y vinagre o limn para Emergency planning/management officeraderezar.  Haga un clculo estimativo de la cantidad de porciones que le sirven. Por ejemplo, una porcin de arroz cocido equivale a media taza o la mitad del tamao de una pelota de bisbol. Conocer el tamao de las porciones lo ayudar a Theme park managerestar atento a la cantidad de comida que come Pitney Bowesen los restaurantes. La lista que sigue le Seafordmuestra el tamao de algunas porciones comunes a partir de objetos cotidianos: ? 1onza (28g) = 4dados apilados. ? 3onzas (85g) = 1mazo de cartas. ? 1cucharadita = 1dado. ? 1cucharada = media pelota de tenis de mesa. ? 2cucharadas = 1pelota de tenis de mesa. ? Media taza = media pelota de bisbol. ? 1taza = 1 pelota de  bisbol. Resumen  El recuento de caloras es el registro de la cantidad de caloras que come y Pharmacologist. Si come menos caloras de las que el cuerpo necesita, debe bajar de Waterproof.  Una cantidad de peso saludable para bajar por semana suele ser Ruth 1 y Ivar Drape (0,5 a 0,9kg). Esto significa, con frecuencia, reducir su ingesta diaria de caloras unas 500 a 750 caloras.  Es posible Animator cantidad de caloras que contiene un alimento en la etiqueta de informacin nutricional. Si un alimento no tiene una etiqueta de informacin nutricional, intente buscar las caloras en Internet o pida ayuda al nutricionista.  Use las caloras de los alimentos y las bebidas que lo sacien y no de los alimentos y las bebidas que lo dejan con apetito.  Use platos, vasos y tazones ms pequeos para no comer en exceso. Esta informacin no tiene Marine scientist el consejo del mdico. Asegrese de hacerle al mdico cualquier pregunta que tenga. Document Released: 01/05/2009 Document Revised: 12/19/2016 Document Reviewed: 12/19/2016 Elsevier Interactive Patient Education  [employer].

## 2017-11-10 NOTE — Progress Notes (Signed)
Manuel Price 52 y.o.   Chief Complaint  Patient presents with  . Follow-up    ear infection     HISTORY OF PRESENT ILLNESS: This is a 51 y.o. male here for follow-up of an ear infection.  Doing well.  No complaints.  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 #2 morbid obesity.  Inquiring about bariatric surgery. #3 essential hypertension.  Doing well.  Taking medications.  HPI   Prior to Admission medications   Medication Sig Start Date End Date Taking? Authorizing Provider  aspirin EC 81 MG tablet Take 81 mg by mouth daily.   Yes [provider]  azithromycin (ZITHROMAX) 250 MG tablet Sig as indicated 10/10/17  Yes Vianney Kopecky, Eilleen Kempf, MD  lisinopril-hydrochlorothiazide (PRINZIDE,ZESTORETIC) 10-12.5 MG tablet Take 1 tablet by mouth daily. 10/10/17  Yes Macel Yearsley, Eilleen Kempf, MD  triamcinolone (NASACORT) 55 MCG/ACT AERO nasal inhaler Place 2 sprays into the nose daily. 07/24/17  Yes SagardiaEilleen Kempf, MD    No Known Allergies  Patient Active Problem List   Diagnosis Date Noted  . Acute otalgia, bilateral 10/10/2017  . Acute right otitis media 10/10/2017  . History of hypertension 10/10/2017  . History of hyperglycemia 10/10/2017  . Acute upper respiratory infection 07/14/2017  . Screening for colon cancer 07/14/2017  . Chest congestion 07/14/2017  . Increased nasal secretion 03/08/2017  . Obstructive sleep apnea 07/12/2013  . Venous stasis 04/30/2013  . Morbid obesity (HCC) 04/30/2013  . Vitamin D deficiency 03/20/2013    Past Medical History:  Diagnosis Date  . H/O blood clots 15 years +  . Sleep apnea     Past Surgical History:  Procedure Laterality Date  . KNEE ARTHROSCOPY Right   . KNEE SURGERY Right     Social History   Socioeconomic History  . Marital status: Married    Spouse name: Not on file  . Number of children: 3  . Years of education: Not on file  . Highest  education level: Not on file  Social Needs  . Financial resource strain: Not on file  . Food insecurity - worry: Not on file  . Food insecurity - inability: Not on file  . Transportation needs - medical: Not on file  . Transportation needs - non-medical: Not on file  Occupational History  . Occupation: Therapist, sports: Physicist, medical  Tobacco Use  . Smoking status: Former Smoker    Packs/day: 0.20    Years: 3.00    Pack years: 0.60    Types: Cigarettes    Last attempt to quit: 07/12/1998    Years since quitting: 19.3  . Smokeless tobacco: Never Used  Substance and Sexual Activity  . Alcohol use: No    Comment: rarely  . Drug use: No  . Sexual activity: Not on file  Other Topics Concern  . Not on file  Social History Narrative  . Not on file    Family History  Problem Relation Age of Onset  . Stroke Mother   . Emphysema Maternal Grandmother        non smoker     Review of Systems  Constitutional: Negative.  Negative for chills and fever.  HENT: Negative for ear pain and hearing loss.   Respiratory: Negative for shortness of breath.   Cardiovascular: Negative for chest pain.  Gastrointestinal: Negative for nausea and vomiting.  Skin: Negative for rash.  Neurological: Negative for dizziness.  All other systems  reviewed and are negative.   Vitals:   11/10/17 0809  BP: 138/82  Pulse: 67  Resp: 17  Temp: 98.2 F (36.8 C)  SpO2: 98%    Physical Exam  Constitutional: He is oriented to person, place, and time. He appears well-developed.  Morbidly obese  HENT:  Head: Normocephalic and atraumatic.  Right Ear: Tympanic membrane, external ear and ear canal normal.  Left Ear: Tympanic membrane, external ear and ear canal normal.  Some chronic scarring noted to the right tympanic membrane.  Eyes: EOM are normal. Pupils are equal, round, and reactive to light.  Neck: Normal range of motion. Neck supple.  Cardiovascular: Normal rate and regular  rhythm.  Pulmonary/Chest: Effort normal and breath sounds normal.  Musculoskeletal: Normal range of motion.  Neurological: He is alert and oriented to person, place, and time.  Skin: Skin is warm and dry.  Psychiatric: He has a normal mood and affect. His behavior is normal.  Vitals reviewed.    ASSESSMENT & PLAN: Manuel Price was seen today for follow-up.  Diagnoses and all orders for this visit:  Body mass index (BMI) of 50-59.9 in adult [facility](HCC) -     Ambulatory referral to General Surgery -     Amb ref to Medical Nutrition Therapy-MNT  Morbid obesity (HCC) -     Ambulatory referral to General Surgery -     Amb ref to Medical Nutrition Therapy-MNT  Phimosis -     Ambulatory referral to Urology  Acute otalgia, bilateral Comments: resolved  History of hypertension    Patient Instructions       IF you received an x-ray today, you will receive an invoice from [facility]. Please contact [facility] at [phone] with questions or concerns regarding your invoice.   IF you received labwork today, you will receive an invoice from [facility]. Please contact [facility] at [phone] with questions or concerns regarding your invoice.   Our billing staff will not be able to assist you with questions regarding bills from these companies.  You will be contacted with the lab results as soon as they are available. The fastest way to get your results is to activate your My Chart account. Instructions are located on the last page of this paperwork. If you have not heard from us regarding the results in 2 weeks, please contact this office.       Recuento de caloras Manuel bajar de peso Calorie Counting for Edison InternationalWeight Loss Las caloras son unidades de Engineer, drillingenerga. Su cuerpo necesita una cierta cantidad de caloras de los alimentos Manuel que le ayuden a Visual merchandiserpasar el da. Cuando come ms caloras de las que el cuerpo necesita, este acumula las caloras extra [place]o grasa. Cuando come  Nationwide Mutual Insurancemenos caloras de las que el cuerpo Woodbinenecesita, este quema grasa Manuel obtener la energa que requiere. El recuento de caloras es el registro de la cantidad de caloras que come y Investment banker, operationalbebe cada da. El recuento de caloras puede ser de ayuda si necesita perder peso. Si se asegura de comer menos caloras de las que el cuerpo necesita, debe bajar de [place]. Pregntele al mdico cul es un peso sano Manuel usted. Manuel que el recuento de caloras funcione, tendr que comer la cantidad de caloras adecuadas Manuel usted en un da, Manuel bajar una cantidad de peso saludable por semana. Un nutricionista puede determinar la cantidad de caloras que necesita por da y sugerirle cmo alcanzar su objetivo calrico.  Neomia DearUna cantidad de peso saludable Manuel bajar por semana suele ser  entre 1 y Enedina Finner (0,5 a 0,9kg). Esto significa con frecuencia que su ingesta diaria de caloras se debera reducir unas 500 a 750caloras.  Ingerir 1200 a 1500caloras por Clinical research associate a la [employer] de las mujeres a Curator.  Ingerir de 1500 a 1800caloras por Clinical research associate a la [employer] de los hombres a Curator.  En qu consiste el plan? Mi objetivo es comer __________ Garry Heater da. Si como esta cantidad de caloras por da, debo bajar unas __________ Albertine Grates. Qu debo saber acerca del recuento de caloras? A fin de alcanzar su objetivo diario de caloras, tendr que:  Averiguar cuntas caloras hay en cada alimento que le Lobbyist. Intente hacerlo antes de comer.  Decida la cantidad que puede comer del alimento.  Anote lo que comi y cuntas caloras tena. Esta tarea se conoce como llevar un registro de comidas.  Manuel perder peso con xito es importante equilibrar el recuento de caloras con un estilo de vida saludable que incluya actividad fsica de forma regular. Tenga un objetivo de de ejercicio moderado (como caminar) o 75 minutos de ejercicio vigoroso (como correr) todas las  semanas. Dnde encuentro informacin sobre las caloras?  Es posible Veterinary surgeon cantidad de caloras que contiene un alimento en la etiqueta de informacin nutricional. Si un alimento no tiene una etiqueta de informacin nutricional, intente buscar las caloras en Internet o pida ayuda al nutricionista. Recuerde que las caloras se calculan por porcin. Si opta por comer ms de una porcin de un alimento, tendr [employer] las caloras por porcin por la cantidad de porciones que planea comer. Por ejemplo, la etiqueta de un envase de pan puede decir que el tamao de una porcin es [place], y que una porcin tiene 90caloras. Si come 1rodaja, habr comido 90caloras. Si come 2rodajas, habr comido 180caloras. Cmo llevo un registro de comidas? Despus de cada comida, registre la siguiente informacin en el registro de comidas:  Lo que comi. No olvide incluir los aderezos, las salsas y otros extras de la comida.  La cantidad que comi. Esto se puede medir en tazas, onzas o cantidad de alimentos.  Cuntas caloras ingiri por comida y por bebida.  La cantidad total de caloras en la comida.  Tenga a Scientific laboratory technician de comidas, por ejemplo, en un anotador de bolsillo o utilice una aplicacin mvil o sitio web. Algunos programas calcularn las caloras y Insurance account manager la cantidad de caloras que le quedan Manuel llegar al objetivo diario. Cules son algunos consejos Manuel el recuento de caloras?  Use las caloras de los alimentos y las bebidas que lo sacien y no lo dejen con apetito: ? Algunos ejemplos de alimentos que lo sacian son los frutos secos y Civil engineer, contracting de frutos secos, verduras, Associate Professor y Forensic scientist con alto contenido de Research scientist (life sciences) como los cereales integrales. Los alimentos con alto contenido de [place] son aquellos que tienen ms de 5g de fibra por porcin. ? Las [employer] refrescos, especialmente las bebidas a base de caf y los jugos, que contienen muchas  caloras, pero no le dan saciedad.  Coma alimentos nutritivos y evite las caloras vacas. Las caloras vacas son aquellas que se obtienen de los alimentos o las bebidas que no contienen muchos nutrientes ni protenas, como los dulces y los refrescos. Es mejor comer una comida nutritiva altamente calrica (como un aguacate) que una con pocos nutrientes (como una bolsa de patatas fritas).  Sepa cuntas caloras tienen los alimentos que  come con ms frecuencia. Esto le ayudar a contar las caloras ms rpidamente.  Preste atencin a las [employer] Brands. Las bebidas de bajas caloras incluyen agua y refrescos sin International aid/development worker.  Preste atencin a las etiquetas nutricionales de alimentos "bajos en grasas" o "sin grasas". Estos alimentos a veces tienen la misma cantidad de caloras o ms caloras que las versiones ricas en grasa. Con frecuencia, tambin tienen agregados de azcar, almidn o sal, Manuel darles el sabor que fue eliminado con la grasa.  Encuentre un mtodo Manuel controlar las caloras que funcione Manuel usted. Sea creativo. Pruebe aplicaciones o programas distintos, si llevar un registro de las caloras no funciona Manuel usted. Cules son algunos consejos Manuel controlar las porciones?  Sepa cuntas caloras hay en una porcin. Esto lo ayudar a saber cuntas porciones de un alimento determinado puede comer.  Use una taza medidora Manuel medir los tamaos de las porciones. Tambin Hydrographic surveyor las porciones en una balanza de cocina. Con el tiempo, podr hacer un clculo estimativo de los tamaos de las porciones de algunos alimentos.  Dedique tiempo a poner porciones de diferentes alimentos en sus platos, tazones y tazas predilectos, a fin de saber cmo se ve una porcin.  Intente no comer directamente de una bolsa o una caja. Esto puede llevarlo a comer en exceso. Ponga la cantidad [employer] gustara comer en una taza o un plato, a fin de asegurarse de que est comiendo la porcin  correcta.  Use platos, vasos y tazones ms pequeos Manuel no comer en exceso.  Intente no realizar varias tareas al [employer] (como mirar la TV o usar su computadora) [place] come. Si es la hora de comer, sintese a Museum/gallery conservator y disfrute de Chemical engineer. Esto lo ayudar a Public house manager cundo est satisfecho. Tambin le ayudar a tomar conciencia de lo que est comiendo y de la cantidad. Cules son algunos consejos Manuel seguir este plan? Lectura de las etiquetas de los alimentos  Controle el recuento de caloras en comparacin con el tamao de la porcin. El tamao de la porcin puede ser ms pequeo de lo que suele comer.  Verifique la fuente de las caloras. Asegrese de que la comida que ingiere tenga alto contenido de vitaminas y protenas y sea baja en grasas saturadas y grasas trans. De compras  Lea las etiquetas nutricionales cuando compre. Esto le ayudar a tomar decisiones ms saludables antes de comprar Standard Pacific.  Haga una lista Manuel el almacn y resptela. La coccin  Intente cocinar sus alimentos preferidos de una manera ms saludable. Por ejemplo, pruebe hornear en vez de frer.  Utilice productos lcteos descremados. Planificacin de los alimentos  Utilice ms frutas y verduras. La mitad de sus platos debe ser de frutas y verduras.  Incluya protenas [employer] y el pescado. Cmo puedo hacer el recuento de caloras cuando como afuera?  Pida porciones ms pequeas.  Considere la posibilidad de Agricultural consultant un plato principal y las guarniciones, en lugar de pedir su propio plato principal.  Si pide su propio plato principal, coma solo la mitad. Pida una caja al comienzo de la comida y ponga all el resto del plato principal, Manuel no sentir la tentacin de comerlo.  Si se detallan las caloras en el men, elija las opciones que contengan la menor cantidad.  Elija platos que incluyan verduras, frutas, cereales integrales, productos lcteos con bajo contenido de grasa  y Associate Professor.  Opte por los alimentos hervidos, asados, cocidos a  la parrilla o al vapor. No coma alimentos que contengan mantequilla, estn empanados, fritos o que se sirvan con salsa a base de crema. Generalmente, los alimentos que se etiquetan como "crujientes" estn fritos, a menos que se indique lo contrario.  Elija el agua, la [place] t helado sin azcar u otras bebidas que no contengan azcares agregados. Si desea una bebida alcohlica, escoja una opcin con menos caloras como una copa de vino o una cerveza ligera.  Ordene los Pathmark Stores, las salsas y los jarabes aparte. Estos son, con frecuencia, de alto contenido en caloras, por lo que debe limitar la cantidad que ingiere.  Si desea [place], elija una de hortalizas y pida carnes a la parrilla. Evite las guarniciones adicionales como el tocino, el queso o los alimentos fritos. Ordene el aderezo aparte o pida aceite de [place] y vinagre o limn Manuel Emergency planning/management officer.  Haga un clculo estimativo de la cantidad de porciones que le sirven. Por ejemplo, una porcin de arroz cocido equivale a media taza o la mitad del tamao de una pelota de bisbol. Conocer el tamao de las porciones lo ayudar a Theme park manager atento a la cantidad de comida que come [employer]ney Bowes. La lista que sigue le [place] el tamao de algunas porciones comunes a partir de objetos cotidianos: ? 1onza (28g) = 4dados apilados. ? 3onzas (85g) = de cartas. ? 1cucharadita = 1dado. ? 1cucharada = media pelota de tenis de mesa. ? 2cucharadas = 1pelota de tenis de mesa. ? Media taza = media pelota de bisbol. ? 1taza = 1 pelota de bisbol. Resumen  El recuento de caloras es el registro de la cantidad de caloras que come y Investment banker, operational. Si come menos caloras de las que el cuerpo necesita, debe bajar de [place].  Una cantidad de peso saludable Manuel bajar por semana suele ser [place] 1 y Enedina Finner (0,5 a 0,9kg). Esto significa, con frecuencia, reducir  su ingesta diaria de caloras unas 500 a 750 caloras.  Es posible Veterinary surgeon cantidad de caloras que contiene un alimento en la etiqueta de informacin nutricional. Si un alimento no tiene una etiqueta de informacin nutricional, intente buscar las caloras en Internet o pida ayuda al nutricionista.  Use las caloras de los alimentos y las bebidas que lo sacien y no de los alimentos y las bebidas que lo dejan con apetito.  Use platos, vasos y tazones ms pequeos Manuel no comer en exceso. Esta informacin no tiene Theme park manager el consejo del mdico. Asegrese de hacerle al mdico cualquier pregunta que tenga. Document Released: 01/05/2009 Document Revised: 12/19/2016 Document Reviewed: 12/19/2016 Elsevier Interactive Patient Education  2018 Elsevier Inc.      Edwina Barth, MD [facility]

## 2017-11-17 ENCOUNTER — Encounter: Payer: Self-pay | Admitting: Internal Medicine

## 2017-11-29 ENCOUNTER — Other Ambulatory Visit: Payer: Self-pay | Admitting: Internal Medicine

## 2017-11-29 ENCOUNTER — Telehealth: Payer: Self-pay | Admitting: *Deleted

## 2017-11-29 DIAGNOSIS — Z1211 Encounter for screening for malignant neoplasm of colon: Secondary | ICD-10-CM

## 2017-11-29 NOTE — Telephone Encounter (Signed)
Patient is for direct screening colonoscopy, his last BMI on 11/10/17 was 54.88, last weight 399 lbs. Would you like direct hospital colon or OV first? Please advise. Thank you, Normagene Harvie PV

## 2017-11-29 NOTE — Telephone Encounter (Signed)
Noted! Thank you

## 2017-11-29 NOTE — Telephone Encounter (Signed)
[facility] been [facility] on 01/30/18 at 1130 am with 10:00 am arrival. Please advise him of this at his upcoming previsit. His [facility] procedure has been cancelled.

## 2017-11-29 NOTE — Telephone Encounter (Signed)
Ok for direct to hospital for screening colon Hospital setting given BMI > 50

## 2017-12-12 ENCOUNTER — Ambulatory Visit (AMBULATORY_SURGERY_CENTER): Payer: Self-pay

## 2017-12-12 ENCOUNTER — Other Ambulatory Visit: Payer: Self-pay

## 2017-12-12 ENCOUNTER — Encounter: Payer: Self-pay | Admitting: Internal Medicine

## 2017-12-12 VITALS — Ht 71.0 in | Wt >= 6400 oz

## 2017-12-12 DIAGNOSIS — Z1211 Encounter for screening for malignant neoplasm of colon: Secondary | ICD-10-CM

## 2017-12-12 MED ORDER — NA SULFATE-K SULFATE-MG SULF 17.5-3.13-1.6 GM/177ML PO SOLN
1.0000 | Freq: Once | ORAL | 0 refills | Status: AC
Start: 2017-12-12 — End: 2017-12-12

## 2017-12-12 NOTE — Progress Notes (Signed)
Denies allergies to eggs or soy products. Denies complication of anesthesia or sedation. Denies use of weight loss medication. Denies use of O2.   Emmi instructions declined.   Patients pre-visit was conducted with the patients daughter present to help with translations. Patient understood some english.  Patient states he no-longer has insurance so a sample of suprep was given.

## 2017-12-13 ENCOUNTER — Ambulatory Visit (INDEPENDENT_AMBULATORY_CARE_PROVIDER_SITE_OTHER): Payer: Self-pay | Admitting: Internal Medicine

## 2017-12-13 ENCOUNTER — Encounter: Payer: Self-pay | Admitting: Internal Medicine

## 2017-12-13 VITALS — BP 120/80 | HR 74 | Ht 71.5 in | Wt 397.0 lb

## 2017-12-13 DIAGNOSIS — Z23 Encounter for immunization: Secondary | ICD-10-CM

## 2017-12-13 DIAGNOSIS — G4733 Obstructive sleep apnea (adult) (pediatric): Secondary | ICD-10-CM

## 2017-12-13 NOTE — Assessment & Plan Note (Signed)
He reports good compliance and control and benefits by sleeping better with CPAP.  Pressure auto 5-15 remains appropriate Flu vaccine given at this visit with discussion

## 2017-12-13 NOTE — Assessment & Plan Note (Signed)
Weight today 397 pounds. Encourage weight loss

## 2017-12-13 NOTE — Patient Instructions (Addendum)
Order printed for supplies  We can continue CPAP auto 5-15, mask of choice, humidifier, supplies, [facility]  Flu vax- standard  Please call if we can help

## 2017-12-13 NOTE — Progress Notes (Signed)
HPI  male never smoker followed for OSA, complicated by morbid obesity, NPSG 05/15/13-[facility] severe obstructive sleep apnea- AHI 103.3/ hr, desat to 73%, weight 385 lbs  -----------------------------------------------------------------------------------  12/15/2016-52 year old male never smoker followed for OSA, complicated by morbid obesity, CPAP 12/[facility]  >> today to auto 5-15 self pay                    Daughter is here as Nurse, learning disability if needed FOLLOWS FOR: Wears CPAP nightly. Denies problems with mask/pressure. DME: [facility] Weight 396 pounds Download confirms excellent compliance 98% 4 hours, excellent control AHI 1.0. He says he sleeps better with it and wife tells him he does not snore wearing CPAP. He also asks for pneumonia vaccine which we reviewed. He has never had one. Has just started phentermine from bariatric center for weight loss  12/13/17- 52 year old male never smoker followed for OSA, complicated by morbid obesity, CPAP auto 5-15/[facility]        Daughter is here as Nurse, learning disability if needed. Some English ----OSA; Buys online CPAP and supplies. DL attached. Pt states he is using CPAP nightly and will need Rx printed for supplies.  Download shows 50% compliance on this machine AHI 1.4/hour.  He uses his other machine when he is in [place].  Sleeps on CPAP every night and is comfortable, sleeping much better with it.  He feels he is doing fine.  Daughter here helps with translation and agrees.  Pressure is comfortable.  He denies other active health issues now.  ROS-see HPI + = positive Constitutional:   No-   weight loss, night sweats, fevers, chills, fatigue, lassitude. HEENT:   No-  headaches, difficulty swallowing, tooth/dental problems, sore throat,       No-  sneezing, itching, ear ache, nasal congestion, post nasal drip,  CV:  No-   chest pain, orthopnea, PND, + chronic swelling in lower extremities, anasarca, dizziness, palpitations Resp: +shortness of breath with  exertion or at rest.              No-   productive cough,  No non-productive cough,  No- coughing up of blood.              No-   change in color of mucus.  No- wheezing.   Skin: No-   rash or lesions. GI:  No-   heartburn, indigestion, abdominal pain, nausea, vomiting,  GU:  MS:  No-   joint pain or swelling.  Neuro-     nothing unusual Psych:  No- change in mood or affect. No depression or anxiety.  No memory loss.  OBJ- Physical Exam General- Alert, Oriented, Affect-appropriate, Distress- none acute. +morbidly obese very thick neck Skin- rash-none, lesions- none, excoriation- none Lymphadenopathy- none Head- atraumatic            Eyes- Gross vision intact, PERRLA, conjunctivae and secretions clear            Ears- Hearing, canals-normal            Nose- Clear, no-Septal dev, mucus, polyps, erosion, perforation             Throat- Mallampati III , mucosa clear , drainage- none, +tonsils present Neck- flexible , trachea midline, no stridor , thyroid nl, carotid no bruit Chest - symmetrical excursion , unlabored           Heart/CV- RRR , no murmur , no gallop  , no rub, nl s1 s2                           -  JVD- none , +edema+1-2 under elastic support hose , varices- none           Lung- clear to P&A, wheeze- none, cough- none , dullness-none, rub- none           Chest wall-  Abd-  Br/ Gen/ Rectal- Not done, not indicated Extrem- cyanosis- none, clubbing, none, atrophy- none, strength- nl Neuro- grossly intact to observation

## 2017-12-15 ENCOUNTER — Ambulatory Visit: Payer: Self-pay | Admitting: Internal Medicine

## 2017-12-25 ENCOUNTER — Encounter: Payer: Self-pay | Admitting: Internal Medicine

## 2018-01-01 ENCOUNTER — Encounter: Payer: Self-pay | Admitting: Emergency Medicine

## 2018-01-02 ENCOUNTER — Encounter: Payer: Self-pay | Admitting: Emergency Medicine

## 2018-01-18 ENCOUNTER — Other Ambulatory Visit: Payer: Self-pay

## 2018-01-18 ENCOUNTER — Encounter (HOSPITAL_COMMUNITY): Payer: Self-pay | Admitting: *Deleted

## 2018-01-18 NOTE — Progress Notes (Signed)
REQUESTED SPANISH INTERPRETER VIA EMAIL TO ARRIVE 1000 AM 01-30-18 FOR DR PYRTLE COLONSCOPY

## 2018-01-30 ENCOUNTER — Ambulatory Visit (HOSPITAL_COMMUNITY): Payer: Self-pay | Admitting: Anesthesiology

## 2018-01-30 ENCOUNTER — Encounter (HOSPITAL_COMMUNITY): Payer: Self-pay

## 2018-01-30 ENCOUNTER — Encounter (HOSPITAL_COMMUNITY)
Admission: RE | Disposition: A | Discharge status: Home / Self Care | Payer: Self-pay | Source: Ambulatory Visit | Attending: Internal Medicine

## 2018-01-30 ENCOUNTER — Ambulatory Visit (HOSPITAL_COMMUNITY)
Admission: RE | Admit: 2018-01-30 | Discharge: 2018-01-30 | Disposition: A | Discharge status: Home / Self Care | Payer: Self-pay | Source: Ambulatory Visit | Attending: Internal Medicine | Admitting: Internal Medicine

## 2018-01-30 ENCOUNTER — Other Ambulatory Visit: Payer: Self-pay

## 2018-01-30 DIAGNOSIS — I10 Essential (primary) hypertension: Secondary | ICD-10-CM | POA: Insufficient documentation

## 2018-01-30 DIAGNOSIS — Z6841 Body Mass Index (BMI) 40.0 and over, adult: Secondary | ICD-10-CM | POA: Insufficient documentation

## 2018-01-30 DIAGNOSIS — G4730 Sleep apnea, unspecified: Secondary | ICD-10-CM | POA: Insufficient documentation

## 2018-01-30 DIAGNOSIS — Z9989 Dependence on other enabling machines and devices: Secondary | ICD-10-CM | POA: Insufficient documentation

## 2018-01-30 DIAGNOSIS — Z87891 Personal history of nicotine dependence: Secondary | ICD-10-CM | POA: Insufficient documentation

## 2018-01-30 DIAGNOSIS — Z1211 Encounter for screening for malignant neoplasm of colon: Secondary | ICD-10-CM | POA: Insufficient documentation

## 2018-01-30 HISTORY — PX: COLONOSCOPY WITH PROPOFOL: SHX5780

## 2018-01-30 HISTORY — DX: Localized edema: R60.0

## 2018-01-30 SURGERY — COLONOSCOPY WITH PROPOFOL
Anesthesia: Monitor Anesthesia Care

## 2018-01-30 MED ORDER — PROPOFOL 10 MG/ML IV BOLUS
INTRAVENOUS | Status: DC | PRN
  Administered 2018-01-30 (×2): 20 mg via INTRAVENOUS

## 2018-01-30 MED ORDER — SODIUM CHLORIDE 0.9 % IV SOLN: INTRAVENOUS | Status: DC

## 2018-01-30 MED ORDER — PROPOFOL 500 MG/50ML IV EMUL
INTRAVENOUS | Status: DC | PRN
  Administered 2018-01-30: 120 ug/kg/min via INTRAVENOUS

## 2018-01-30 MED ORDER — LIDOCAINE 2% (20 MG/ML) 5 ML SYRINGE
INTRAMUSCULAR | Status: DC | PRN
  Administered 2018-01-30: 100 mg via INTRAVENOUS

## 2018-01-30 MED ORDER — LACTATED RINGERS IV SOLN
INTRAVENOUS | Status: DC
  Administered 2018-01-30: 1000 mL via INTRAVENOUS

## 2018-01-30 MED ORDER — PROPOFOL 10 MG/ML IV BOLUS
INTRAVENOUS | Status: AC
  Filled 2018-01-30: qty 60

## 2018-01-30 SURGICAL SUPPLY — 21 items

## 2018-01-30 NOTE — H&P (Signed)
HPI: Manuel Price is a 52 year old male with a past medical history of obesity, sleep apnea, remote blood clots years ago after knee surgery not on anticoagulation and hypertension who presents for outpatient screening colonoscopy.  No GI complaints.  No family history of GI cancer.  No prior colonoscopy.  Tolerated the prep well.  Here today with his daughter  Past Medical History:  Diagnosis Date  . Allergy    SEASONAL  . H/O blood clots 15 years +   RIGHT KNEE AFTER SURGERY NO CAUSE FOUND  . Hypertension   . Lower extremity edema    RIGHT LEG PROPS UP AND GOES DOWN OCC  . Sleep apnea     Past Surgical History:  Procedure Laterality Date  . KNEE ARTHROSCOPY Right 20 YRS AGO     No Known Allergies  Family History  Problem Relation Age of Onset  . Stroke Mother   . Emphysema Maternal Grandmother        non smoker  . Colon cancer Neg Hx   . Esophageal cancer Neg Hx   . Liver cancer Neg Hx   . Pancreatic cancer Neg Hx   . Rectal cancer Neg Hx   . Stomach cancer Neg Hx     Social History   Tobacco Use  . Smoking status: Former Smoker    Packs/day: 0.20    Years: 3.00    Pack years: 0.60    Types: Cigarettes    Last attempt to quit: 07/12/1998    Years since quitting: 19.5  . Smokeless tobacco: Never Used  Substance Use Topics  . Alcohol use: No    Comment: rarely  . Drug use: No    ROS: As per history of present illness, otherwise negative  BP (!) 146/86   Pulse 64   Temp 98.6 F (37 C) (Oral)   Resp 17   Ht _0  (1.803 m)   Wt (!) 397 lb (180.1 kg)   SpO2 96%   BMI 55.37 kg/m  Gen: awake, alert, NAD HEENT: anicteric, op clear CV: RRR, no mrg Pulm: CTA b/l Abd: soft, obese, NT/ND, +BS throughout Ext: no c/c/e Neuro: nonfocal   RELEVANT LABS AND IMAGING: CBC    Component Value Date/Time   WBC 7.2 10/10/2017 1652   WBC 5.6 01/05/2016 1021   WBC 5.2 04/20/2014 0923   RBC 4.50 10/10/2017 1652   RBC 4.99 01/05/2016 1021   RBC 4.79  04/20/2014 0923   HGB 14.0 10/10/2017 1652   HCT 40.2 10/10/2017 1652   PLT 229 10/10/2017 1652   MCV 89 10/10/2017 1652   MCH 31.1 10/10/2017 1652   MCH 32.0 (A) 01/05/2016 1021   MCH 31.7 04/20/2014 0923   MCHC 34.8 10/10/2017 1652   MCHC 35.0 01/05/2016 1021   MCHC 35.8 04/20/2014 0923   RDW 13.0 10/10/2017 1652   LYMPHSABS 3.1 10/10/2017 1652   MONOABS 0.5 04/20/2014 0923   EOSABS 0.2 10/10/2017 1652   BASOSABS 0.0 10/10/2017 1652    CMP     Component Value Date/Time   NA 140 10/10/2017 1652   K 4.3 10/10/2017 1652   CL 105 10/10/2017 1652   CO2 22 10/10/2017 1652   GLUCOSE 99 10/10/2017 1652   GLUCOSE 151 (H) 03/15/2013 1359   BUN 14 10/10/2017 1652   CREATININE 0.99 10/10/2017 1652   CREATININE 0.89 03/15/2013 1359   CALCIUM 9.0 10/10/2017 1652   PROT 6.6 10/10/2017 1652   ALBUMIN 4.1 10/10/2017 1652   AST 36  10/10/2017 1652   ALT 61 (H) 10/10/2017 1652   ALKPHOS 60 10/10/2017 1652   BILITOT 0.4 10/10/2017 1652   GFRNONAA 87 10/10/2017 1652   GFRAA 100 10/10/2017 1652    ASSESSMENT/PLAN:  53 year old male with a past medical history of obesity, sleep apnea, remote blood clots years ago after knee surgery not on anticoagulation and hypertension who presents for outpatient screening colonoscopy.  1. CRC screening, avg risk --for screening colonoscopy.  Initial screening colonoscopy planned with MAC.  The nature of the procedure, as well as the risks, benefits, and alternatives were carefully and thoroughly reviewed with the patient. Ample time for discussion and questions allowed. The patient understood, was satisfied, and agreed to proceed.

## 2018-01-30 NOTE — Discharge Instructions (Signed)

## 2018-01-30 NOTE — Transfer of Care (Signed)
Immediate Anesthesia Transfer of Care Note  Patient: Aron Inge  Procedure(s) Performed: COLONOSCOPY WITH PROPOFOL (N/A )  Patient Location: Endoscopy Unit  Anesthesia Type:MAC  Level of Consciousness: awake  Airway & Oxygen Therapy: Patient Spontanous Breathing and Patient connected to face mask oxygen  Post-op Assessment: Report given to RN and Post -op Vital signs reviewed and stable  Post vital signs: Reviewed and stable  Last Vitals:  Vitals Value Taken Time  BP    Temp    Pulse 72 01/30/2018 12:30 PM  Resp    SpO2 98 % 01/30/2018 12:30 PM  Vitals shown include unvalidated device data.  Last Pain:  Vitals:   01/30/18 1044  TempSrc: Oral  PainSc: 0-No pain         Complications: No apparent anesthesia complications

## 2018-01-30 NOTE — Op Note (Signed)
[facility] Patient Name: Manuel Price Procedure Date: 01/30/2018 MRN: [id number] Attending MD: Beverley Fiedler , MD Date of Birth: 1966-03-15 CSN: [id number] Age: 52 Admit Type: Ambulatory Procedure:                Colonoscopy Indications:              Screening for colorectal malignant neoplasm, This                            is the patient's first colonoscopy Providers:                Carie Caddy. Rhea Belton, MD, Priscella Mann RN, RN, Margo Aye, Technician Referring MD:             [facility] [place] Medicines:                Monitored Anesthesia Care Complications:            No immediate complications. Estimated Blood Loss:     Estimated blood loss: none. Procedure:                Pre-Anesthesia Assessment:                           - Prior to the procedure, a History and Physical                            was performed, and patient medications and                            allergies were reviewed. The patient's tolerance of                            previous anesthesia was also reviewed. The risks                            and benefits of the procedure and the sedation                            options and risks were discussed with the patient.                            All questions were answered, and informed consent                            was obtained. Prior Anticoagulants: The patient has                            taken no previous anticoagulant or antiplatelet                            agents. ASA Grade Assessment: III - A patient with  severe systemic disease. After reviewing the risks                            and benefits, the patient was deemed in                            satisfactory condition to undergo the procedure.                           After obtaining informed consent, the colonoscope                            was passed under direct vision. Throughout the             procedure, the patient's blood pressure, pulse, and                            oxygen saturations were monitored continuously. The                            EC-[id number] ([id number]) scope was introduced through                            the anus and advanced to the cecum, identified by                            appendiceal orifice and ileocecal valve. The                            colonoscopy was performed without difficulty. The                            patient tolerated the procedure well. The quality                            of the bowel preparation was good. The ileocecal                            valve, appendiceal orifice, and rectum were                            photographed. Scope In: 12:12:19 PM Scope Out: 12:23:51 PM Scope Withdrawal Time: 0 hours 9 minutes 57 seconds  Total Procedure Duration: 0 hours 11 minutes 32 seconds  Findings:      The digital rectal exam was normal.      The entire examined colon appeared normal on direct and retroflexion       views. Impression:               - The entire examined colon is normal on direct and                            retroflexion views.                           -  No specimens collected. Moderate Sedation:      N/A Recommendation:           - Patient has a contact number available for                            emergencies. The signs and symptoms of potential                            delayed complications were discussed with the                            patient. Return to normal activities tomorrow.                            Written discharge instructions were provided to the                            patient.                           - Resume previous diet.                           - Continue present medications.                           - Await pathology results.                           - Repeat colonoscopy in 10 years for screening                            purposes. Procedure Code(s):        ---  Professional ---                           Z6109, Colorectal cancer screening; colonoscopy on                            individual not meeting criteria for high risk Diagnosis Code(s):        --- Professional ---                           Z12.11, Encounter for screening for malignant                            neoplasm of colon CPT copyright 2017 [employer]. All rights reserved. The codes documented in this report are preliminary and upon coder review may  be revised to meet current compliance requirements. Beverley Fiedler, MD 01/30/2018 12:29:17 PM This report has been signed electronically. Number of Addenda: 0

## 2018-01-30 NOTE — Anesthesia Procedure Notes (Signed)
Date/Time: 01/30/2018 12:02 PM Performed by: Florene Route, CRNA Oxygen Delivery Method: Simple face mask

## 2018-01-30 NOTE — Anesthesia Preprocedure Evaluation (Addendum)
Anesthesia Evaluation  Patient identified by MRN, date of birth, ID band Patient awake    Reviewed: Allergy & Precautions, H&P , NPO status , Patient's Chart, lab work & pertinent test results  Airway Mallampati: III  TM Distance: >3 FB Neck ROM: Full    Dental no notable dental hx. (+) Teeth Intact, Dental Advisory Given   Pulmonary sleep apnea and Continuous Positive Airway Pressure Ventilation , former smoker,    Pulmonary exam normal breath sounds clear to auscultation- rhonchi       Cardiovascular hypertension, Pt. on medications  Rhythm:Regular Rate:Normal     Neuro/Psych negative neurological ROS  negative psych ROS   GI/Hepatic negative GI ROS, Neg liver ROS,   Endo/Other  Morbid obesity  Renal/GU negative Renal ROS  negative genitourinary   Musculoskeletal   Abdominal   Peds  Hematology negative hematology ROS (+)   Anesthesia Other Findings   Reproductive/Obstetrics negative OB ROS                            Anesthesia Physical Anesthesia Plan  ASA: III  Anesthesia Plan: MAC   Post-op Pain Management:    Induction: Intravenous  PONV Risk Score and Plan: 1 and Propofol infusion  Airway Management Planned: Simple Face Mask  Additional Equipment:   Intra-op Plan:   Post-operative Plan:   Informed Consent: I have reviewed the patients History and Physical, chart, labs and discussed the procedure including the risks, benefits and alternatives for the proposed anesthesia with the patient or authorized representative who has indicated his/her understanding and acceptance.   Dental advisory given  Plan Discussed with: CRNA  Anesthesia Plan Comments:         Anesthesia Quick Evaluation

## 2018-01-30 NOTE — Anesthesia Postprocedure Evaluation (Signed)
Anesthesia Post Note  Patient: Manuel Price  Procedure(s) Performed: COLONOSCOPY WITH PROPOFOL (N/A )     Patient location during evaluation: PACU Anesthesia Type: MAC Level of consciousness: awake and alert Pain management: pain level controlled Vital Signs Assessment: post-procedure vital signs reviewed and stable Respiratory status: spontaneous breathing, nonlabored ventilation and respiratory function stable Cardiovascular status: stable and blood pressure returned to baseline Postop Assessment: no apparent nausea or vomiting Anesthetic complications: no    Last Vitals:  Vitals:   01/30/18 1230 01/30/18 1231  BP: 130/80   Pulse: 76 72  Resp: 18   Temp: (!) 36.4 C   SpO2: 97% 98%    Last Pain:  Vitals:   01/30/18 1230  TempSrc: Oral  PainSc:                  Emilyrose Darrah,W. EDMOND

## 2018-09-07 ENCOUNTER — Ambulatory Visit: Payer: Self-pay

## 2018-09-07 DIAGNOSIS — Z23 Encounter for immunization: Secondary | ICD-10-CM

## 2018-12-14 ENCOUNTER — Ambulatory Visit: Payer: Self-pay | Admitting: Internal Medicine

## 2019-02-01 ENCOUNTER — Ambulatory Visit: Payer: Self-pay | Admitting: Internal Medicine

## 2019-05-21 ENCOUNTER — Ambulatory Visit: Payer: Self-pay | Admitting: Internal Medicine

## 2019-07-08 ENCOUNTER — Other Ambulatory Visit: Payer: Self-pay

## 2019-07-08 ENCOUNTER — Ambulatory Visit: Payer: Self-pay

## 2019-09-10 ENCOUNTER — Other Ambulatory Visit: Payer: Self-pay

## 2019-09-10 ENCOUNTER — Telehealth: Payer: Self-pay | Admitting: Emergency Medicine

## 2019-09-11 ENCOUNTER — Telehealth (INDEPENDENT_AMBULATORY_CARE_PROVIDER_SITE_OTHER): Payer: HRSA Program | Admitting: Emergency Medicine

## 2019-09-11 ENCOUNTER — Encounter: Payer: Self-pay | Admitting: Emergency Medicine

## 2019-09-11 ENCOUNTER — Other Ambulatory Visit: Payer: Self-pay

## 2019-09-11 VITALS — Ht 72.0 in | Wt 230.0 lb

## 2019-09-11 DIAGNOSIS — U071 COVID-19: Secondary | ICD-10-CM

## 2019-09-11 NOTE — Progress Notes (Signed)
Telemedicine Encounter- SOAP NOTE Established Patient  This telephone encounter was conducted with the patient's (or proxy's) verbal consent via audio telecommunications: yes/no: Yes Patient was instructed to have this encounter in a suitably private space; and to only have persons present to whom they give permission to participate. In addition, patient identity was confirmed by use of name plus two identifiers (DOB and address).  I discussed the limitations, risks, security and privacy concerns of performing an evaluation and management service by telephone and the availability of in person appointments. I also discussed with the patient that there may be a patient responsible charge related to this service. The patient expressed understanding and agreed to proceed.  I spent a total of TIME; 0 MIN TO 60 MIN: 15 minutes talking with the patient or their proxy.  Chief Complaint  Patient presents with  . Covid 19    patient tested positive 09/06/2019 and wants to know if any medications to help   . Cough    with clear mucus, pt had loss of smell, runny nose, denies fever,    Subjective   [name] is a 53 y.o. male established patient. Telephone visit today for follow-up of Covid infection.  Tested positive on 09/06/2019 but has been mostly asymptomatic since.  Main symptom is diminished smell, otherwise doing well.  Denies fever or chills, difficulty breathing, wheezing or chest pain.  Able to eat and drink.  Denies nausea or vomiting, abdominal pain, or diarrhea.  Daughter also tested positive and also one grandchild.  Otherwise doing well.  Working from home.  HPI   Patient Active Problem List   Diagnosis Date Noted  . Body mass index (BMI) of 50-59.9 in adult (HCC) 11/10/2017  . Phimosis 11/10/2017  . Acute otalgia, bilateral 10/10/2017  . Acute right otitis media 10/10/2017  . History of hypertension 10/10/2017  . History of hyperglycemia 10/10/2017  . Acute upper  respiratory infection 07/14/2017  . Screening for colon cancer 07/14/2017  . Chest congestion 07/14/2017  . Increased nasal secretion 03/08/2017  . Obstructive sleep apnea 07/12/2013  . Venous stasis 04/30/2013  . Morbid obesity (HCC) 04/30/2013  . Vitamin D deficiency 03/20/2013    Past Medical History:  Diagnosis Date  . Allergy    SEASONAL  . H/O blood clots 15 years +   RIGHT KNEE AFTER SURGERY NO CAUSE FOUND  . Hypertension   . Lower extremity edema    RIGHT LEG PROPS UP AND GOES DOWN OCC  . Sleep apnea     Current Outpatient Medications  Medication Sig Dispense Refill  . aspirin EC 81 MG tablet Take 81 mg by mouth daily.    . Multiple Vitamin (ONE-A-DAY MENS PO) Take 1 tablet by mouth daily.    Marland Kitchen lisinopril-hydrochlorothiazide (PRINZIDE,ZESTORETIC) 10-12.5 MG tablet Take 1 tablet by mouth daily. (Patient not taking: Reported on 09/11/2019) 90 tablet 3   No current facility-administered medications for this visit.     Not on File  Social History   Socioeconomic History  . Marital status: Married    Spouse name: Not on file  . Number of children: 3  . Years of education: Not on file  . Highest education level: Not on file  Occupational History  . Occupation: Therapist, sports: Physicist, medical  Social Needs  . Financial resource strain: Not on file  . Food insecurity    Worry: Not on file    Inability: Not on file  . Transportation needs  Medical: Not on file    Non-medical: Not on file  Tobacco Use  . Smoking status: Former Smoker    Packs/day: 0.20    Years: 3.00    Pack years: 0.60    Types: Cigarettes    Quit date: 07/12/1998    Years since quitting: 21.1  . Smokeless tobacco: Never Used  Substance and Sexual Activity  . Alcohol use: No    Comment: rarely  . Drug use: No  . Sexual activity: Not on file  Lifestyle  . Physical activity    Days per week: Not on file    Minutes per session: Not on file  . Stress: Not on file   Relationships  . Social Herbalist on phone: Not on file    Gets together: Not on file    Attends religious service: Not on file    Active member of club or organization: Not on file    Attends meetings of clubs or organizations: Not on file    Relationship status: Not on file  . Intimate partner violence    Fear of current or ex partner: Not on file    Emotionally abused: Not on file    Physically abused: Not on file    Forced sexual activity: Not on file  Other Topics Concern  . Not on file  Social History Narrative  . Not on file    Review of Systems  Constitutional: Negative.  Negative for chills and fever.  HENT: Negative.  Negative for congestion and sore throat.   Respiratory: Negative.  Negative for cough and shortness of breath.   Cardiovascular: Negative.  Negative for chest pain and palpitations.  Gastrointestinal: Negative.  Negative for abdominal pain, diarrhea, nausea and vomiting.  Genitourinary: Negative.  Negative for dysuria and hematuria.  Musculoskeletal: Negative.  Negative for myalgias.  Skin: Negative.  Negative for rash.  Neurological: Negative for dizziness and headaches.  All other systems reviewed and are negative.   Objective  Alert and oriented x3 in no apparent respiratory distress Vitals as reported by the patient: Today's Vitals   09/11/19 1109  Weight: 230 lb (104.3 kg)  Height: 6' (1.829 m)    There are no diagnoses linked to this encounter. Manuel Price was seen today for covid 19 and cough.  Diagnoses and all orders for this visit:  COVID-19 virus infection    Clinically stable.  No complications.  No red flag signs or symptoms.  Covid instructions and ED precautions given.  Advised to contact the office if clinical picture and or symptoms worsen. I discussed the assessment and treatment plan with the patient. The patient was provided an opportunity to ask questions and all were answered. The patient agreed with the plan  and demonstrated an understanding of the instructions.   The patient was advised to call back or seek an in-person evaluation if the symptoms worsen or if the condition fails to improve as anticipated.  I provided 15 minutes of non-face-to-face time during this encounter.  Horald Pollen, MD  Primary Care at [facility]

## 2019-11-19 ENCOUNTER — Other Ambulatory Visit: Payer: Self-pay

## 2019-11-19 ENCOUNTER — Ambulatory Visit (INDEPENDENT_AMBULATORY_CARE_PROVIDER_SITE_OTHER): Payer: Self-pay | Admitting: Emergency Medicine

## 2019-11-19 ENCOUNTER — Encounter: Payer: Self-pay | Admitting: Emergency Medicine

## 2019-11-19 VITALS — BP 108/73 | HR 51 | Temp 98.2°F | Wt 249.0 lb

## 2019-11-19 DIAGNOSIS — Z6833 Body mass index (BMI) 33.0-33.9, adult: Secondary | ICD-10-CM

## 2019-11-19 DIAGNOSIS — E6609 Other obesity due to excess calories: Secondary | ICD-10-CM

## 2019-11-19 DIAGNOSIS — Z9884 Bariatric surgery status: Secondary | ICD-10-CM

## 2019-11-19 LAB — LIPID PANEL
Chol/HDL Ratio: 2.1 ratio (ref 0.0–5.0)
Cholesterol, Total: 148 mg/dL (ref 100–199)
HDL: 70 mg/dL (ref >39)
LDL Chol Calc (NIH): 64 mg/dL (ref 0–99)
Triglycerides: 74 mg/dL (ref 0–149)
VLDL Cholesterol Cal: 14 mg/dL (ref 5–40)

## 2019-11-19 LAB — CBC WITH DIFFERENTIAL/PLATELET
Basophils Absolute: 0 10*3/uL (ref 0.0–0.2)
Basos: 1 %
EOS (ABSOLUTE): 0.2 10*3/uL (ref 0.0–0.4)
Eos: 3 %
Hematocrit: 42.5 % (ref 37.5–51.0)
Hemoglobin: 14.6 g/dL (ref 13.0–17.7)
Immature Grans (Abs): 0 10*3/uL (ref 0.0–0.1)
Immature Granulocytes: 0 %
Lymphocytes Absolute: 2.7 10*3/uL (ref 0.7–3.1)
Lymphs: 47 %
MCH: 31.6 pg (ref 26.6–33.0)
MCHC: 34.4 g/dL (ref 31.5–35.7)
MCV: 92 fL (ref 79–97)
Monocytes Absolute: 0.4 10*3/uL (ref 0.1–0.9)
Monocytes: 7 %
Neutrophils Absolute: 2.5 10*3/uL (ref 1.4–7.0)
Neutrophils: 42 %
Platelets: 186 10*3/uL (ref 150–450)
RBC: 4.62 x10E6/uL (ref 4.14–5.80)
RDW: 12.3 % (ref 11.6–15.4)
WBC: 5.9 10*3/uL (ref 3.4–10.8)

## 2019-11-19 LAB — COMPREHENSIVE METABOLIC PANEL
ALT: 24 IU/L (ref 0–44)
AST: 22 IU/L (ref 0–40)
Albumin/Globulin Ratio: 2.2 (ref 1.2–2.2)
Albumin: 4.3 g/dL (ref 3.8–4.9)
Alkaline Phosphatase: 74 IU/L (ref 39–117)
BUN/Creatinine Ratio: 16 (ref 9–20)
BUN: 12 mg/dL (ref 6–24)
Bilirubin Total: 0.6 mg/dL (ref 0.0–1.2)
CO2: 24 mmol/L (ref 20–29)
Calcium: 9.5 mg/dL (ref 8.7–10.2)
Chloride: 101 mmol/L (ref 96–106)
Creatinine, Ser: 0.77 mg/dL (ref 0.76–1.27)
GFR calc Af Amer: 118 mL/min/{1.73_m2} (ref >59)
GFR calc non Af Amer: 102 mL/min/{1.73_m2} (ref >59)
Globulin, Total: 2 g/dL (ref 1.5–4.5)
Glucose: 88 mg/dL (ref 65–99)
Potassium: 4.5 mmol/L (ref 3.5–5.2)
Sodium: 139 mmol/L (ref 134–144)
Total Protein: 6.3 g/dL (ref 6.0–8.5)

## 2019-11-19 NOTE — Patient Instructions (Addendum)
   If you have lab work done today you will be contacted with your lab results within the next 2 weeks.  If you have not heard from us then please contact us. The fastest way to get your results is to register for My Chart.   IF you received an x-ray today, you will receive an invoice from [facility]. Please contact [facility] at [phone] with questions or concerns regarding your invoice.   IF you received labwork today, you will receive an invoice from [facility]. Please contact [facility] at [phone] with questions or concerns regarding your invoice.   Our billing staff will not be able to assist you with questions regarding bills from these companies.  You will be contacted with the lab results as soon as they are available. The fastest way to get your results is to activate your My Chart account. Instructions are located on the last page of this paperwork. If you have not heard from us regarding the results in 2 weeks, please contact this office.       Mantenimiento de la salud en los hombres Health Maintenance, Male Adoptar un estilo de vida saludable y recibir atencin preventiva son importantes para promover la salud y el bienestar. Consulte al mdico sobre:  El esquema adecuado para hacerse pruebas y exmenes peridicos.  Cosas que puede hacer por su cuenta para prevenir enfermedades y mantenerse sano. Qu debo saber sobre la dieta, el peso y el ejercicio? Consuma una dieta saludable   Consuma una dieta que incluya muchas verduras, frutas, productos lcteos con bajo contenido de grasa y protenas magras.  No consuma muchos alimentos ricos en grasas slidas, azcares agregados o sodio. Mantenga un peso saludable El ndice de masa muscular (IMC) es una medida que puede utilizarse para identificar posibles problemas de peso. Proporciona una estimacin de la grasa corporal basndose en el peso y la altura. Su mdico puede ayudarle a determinar su IMC y  a lograr o mantener un peso saludable. Haga ejercicio con regularidad Haga ejercicio con regularidad. Esta es una de las prcticas ms importantes que puede hacer por su salud. La mayora de los adultos deben seguir estas pautas:  Realizar, al menos, 150minutos de actividad fsica por semana. El ejercicio debe aumentar la frecuencia cardaca y hacerlo transpirar (ejercicio de intensidad moderada).  Hacer ejercicios de fortalecimiento por lo menos dos veces por semana. Agregue esto a su plan de ejercicio de intensidad moderada.  Pasar menos tiempo sentados. Incluso la actividad fsica ligera puede ser beneficiosa. Controle sus niveles de colesterol y lpidos en la sangre Comience a realizarse anlisis de lpidos y colesterol en la sangre a los 20aos y luego reptalos cada 5aos. Es posible que necesite controlar los niveles de colesterol con mayor frecuencia si:  Sus niveles de lpidos y colesterol son altos.  Es mayor de 40aos.  Presenta un alto riesgo de padecer enfermedades cardacas. Qu debo saber sobre las pruebas de deteccin del cncer? Muchos tipos de cncer pueden detectarse de manera temprana y, a menudo, pueden prevenirse. Segn su historia clnica y sus antecedentes familiares, es posible que deba realizarse pruebas de deteccin del cncer en diferentes edades. Esto puede incluir pruebas de deteccin de lo siguiente:  Cncer colorrectal.  Cncer de prstata.  Cncer de piel.  Cncer de pulmn. Qu debo saber sobre la enfermedad cardaca, la diabetes y la hipertensin arterial? Presin arterial y enfermedad cardaca  La hipertensin arterial causa enfermedades cardacas y aumenta el riesgo de accidente cerebrovascular. Es ms   probable que esto se manifieste en las personas que tienen lecturas de presin arterial alta, tienen ascendencia africana o tienen sobrepeso.  Hable con el mdico sobre sus valores de presin arterial deseados.  Hgase controlar la presin  arterial: ? Cada 3 a 5 aos si tiene entre 18 y 39 aos. ? Todos los aos si es mayor de 40aos.  Si tiene entre 65 y 75 aos y es fumador o sola fumar, pregntele al mdico si debe realizarse una prueba de deteccin de aneurisma artico abdominal (AAA) por nica vez. Diabetes Realcese exmenes de deteccin de la diabetes con regularidad. Este anlisis revisa el nivel de azcar en la sangre en ayunas. Hgase las pruebas de deteccin:  Cada tresaos despus de los 45aos de edad si tiene un peso normal y un bajo riesgo de padecer diabetes.  Con ms frecuencia y a partir de una edad inferior si tiene sobrepeso o un alto riesgo de padecer diabetes. Qu debo saber sobre la prevencin de infecciones? Hepatitis B Si tiene un riesgo ms alto de contraer hepatitis B, debe someterse a un examen de deteccin de este virus. Hable con el mdico para averiguar si tiene riesgo de contraer la infeccin por hepatitis B. Hepatitis C Se recomienda un anlisis de sangre para:  Todos los que nacieron entre 1945 y 1965.  Todas las personas que tengan un riesgo de haber contrado hepatitis C. Enfermedades de transmisin sexual (ETS)  Debe realizarse pruebas de deteccin de ITS todos los aos, incluidas la gonorrea y la clamidia, si: ? Es sexualmente activo y es menor de 24aos. ? Es mayor de 24aos, y el mdico le informa que corre riesgo de tener este tipo de infecciones. ? La actividad sexual ha cambiado desde que le hicieron la ltima prueba de deteccin y tiene un riesgo mayor de tener clamidia o gonorrea. Pregntele al mdico si usted tiene riesgo.  Pregntele al mdico si usted tiene un alto riesgo de contraer VIH. El mdico tambin puede recomendarle un medicamento recetado para ayudar a evitar la infeccin por el VIH. Si elige tomar medicamentos para prevenir el VIH, primero debe hacerse los anlisis de deteccin del VIH. Luego debe hacerse anlisis cada 3meses mientras est tomando los  medicamentos. Siga estas instrucciones en su casa: Estilo de vida  No consuma ningn producto que contenga nicotina o tabaco, como cigarrillos, cigarrillos electrnicos y tabaco de mascar. Si necesita ayuda para dejar de fumar, consulte al mdico.  No consuma drogas.  No comparta agujas.  Solicite ayuda a su mdico si necesita apoyo o informacin para abandonar las drogas. Consumo de alcohol  No beba alcohol si el mdico se lo prohbe.  Si bebe alcohol: ? Limite la cantidad que consume de 0 a 2 medidas por da. ? Est atento a la cantidad de alcohol que hay en las bebidas que toma. En los [place], una medida equivale a una botella de cerveza de 12oz (355ml), un vaso de vino de 5oz (148ml) o un vaso de una bebida alcohlica de alta graduacin de 1oz (44ml). Instrucciones generales  Realcese los estudios de rutina de la salud, dentales y de la vista.  Mantngase al da con las vacunas.  Infrmele a su mdico si: ? Se siente deprimido con frecuencia. ? Alguna vez ha sido vctima de maltrato o no se siente seguro en su casa. Resumen  Adoptar un estilo de vida saludable y recibir atencin preventiva son importantes para promover la salud y el bienestar.  Siga las instrucciones del mdico   acerca de una dieta saludable, el ejercicio y la realizacin de pruebas o exmenes para detectar enfermedades.  Siga las instrucciones del mdico con respecto al control del colesterol y la presin arterial. Esta informacin no tiene como fin reemplazar el consejo del mdico. Asegrese de hacerle al mdico cualquier pregunta que tenga. Document Revised: 10/10/2018 Document Reviewed: 10/10/2018 Elsevier Patient Education  2020 Elsevier Inc.  

## 2019-11-19 NOTE — Progress Notes (Signed)
Manuel Price 54 y.o.   Chief Complaint  Patient presents with  . gastric sleeve surgery    per pt it was done on 06/21/2018 in [place] and wants labs done to check    HISTORY OF PRESENT ILLNESS: This is a 54 y.o. 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  Doing well and has no complaints or medical concerns today. Status post Covid infection last year.  Asymptomatic. Wt Readings from Last 3 Encounters:  11/19/19 249 lb (112.9 kg)  09/11/19 230 lb (104.3 kg)  01/30/18 (!) 397 lb (180.1 kg)    HPI   Prior to Admission medications   Medication Sig Start Date End Date Taking? Authorizing Provider  aspirin EC 81 MG tablet Take 81 mg by mouth daily.   Yes [provider]  Cyanocobalamin (VITAMIN B 12 PO) Take 3,000 mg by mouth.   Yes [provider]  Niacin (VITAMIN B-3 PO) Take by mouth daily.   Yes [provider]  lisinopril-hydrochlorothiazide (PRINZIDE,ZESTORETIC) 10-12.5 MG tablet Take 1 tablet by mouth daily. Patient not taking: Reported on 09/11/2019 10/10/17   Horald Pollen, MD  Multiple Vitamin (ONE-A-DAY MENS PO) Take 1 tablet by mouth daily.    [provider]    No Known Allergies  Patient Active Problem List   Diagnosis Date Noted  . History of hypertension 10/10/2017  . Obstructive sleep apnea 07/12/2013  . Morbid obesity ([place]) 04/30/2013    Past Medical History:  Diagnosis Date  . Allergy    SEASONAL  . H/O blood clots 15 years +   RIGHT KNEE AFTER SURGERY NO CAUSE FOUND  . Hypertension   . Lower extremity edema    RIGHT LEG PROPS UP AND GOES DOWN OCC  . Sleep apnea     Past Surgical History:  Procedure Laterality Date  . COLONOSCOPY WITH PROPOFOL N/A 01/30/2018   Procedure: COLONOSCOPY WITH PROPOFOL;  Surgeon: Jerene Bears, MD;  Location: [facility];  Service: Gastroenterology;  Laterality: N/A;  . KNEE ARTHROSCOPY  Right 20 YRS AGO    Social History   Socioeconomic History  . Marital status: Married    Spouse name: Not on file  . Number of children: 3  . Years of education: Not on file  . Highest education level: Not on file  Occupational History  . Occupation: Agricultural consultant: Air traffic controller  Tobacco Use  . Smoking status: Former Smoker    Packs/day: 0.20    Years: 3.00    Pack years: 0.60    Types: Cigarettes    Quit date: 07/12/1998    Years since quitting: 21.3  . Smokeless tobacco: Never Used  Substance and Sexual Activity  . Alcohol use: No    Comment: rarely  . Drug use: No  . Sexual activity: Not on file  Other Topics Concern  . Not on file  Social History Narrative  . Not on file   Social Determinants of Health   Financial Resource Strain:   . Difficulty of Paying Living Expenses: Not on file  Food Insecurity:   . Worried About Charity fundraiser in the Last Year: Not on file  . Ran Out of Food in the Last Year: Not on file  Transportation Needs:   . Lack of Transportation (Medical): Not on file  . Lack of Transportation (Non-Medical): Not on file  Physical Activity:   . Days of  Exercise per Week: Not on file  . Minutes of Exercise per Session: Not on file  Stress:   . Feeling of Stress : Not on file  Social Connections:   . Frequency of Communication with Friends and Family: Not on file  . Frequency of Social Gatherings with Friends and Family: Not on file  . Attends Religious Services: Not on file  . Active Member of Clubs or Organizations: Not on file  . Attends Banker Meetings: Not on file  . Marital Status: Not on file  Intimate Partner Violence:   . Fear of Current or Ex-Partner: Not on file  . Emotionally Abused: Not on file  . Physically Abused: Not on file  . Sexually Abused: Not on file    Family History  Problem Relation Age of Onset  . Stroke Mother   . Emphysema Maternal Grandmother        non smoker  . Colon  cancer Neg Hx   . Esophageal cancer Neg Hx   . Liver cancer Neg Hx   . Pancreatic cancer Neg Hx   . Rectal cancer Neg Hx   . Stomach cancer Neg Hx      Review of Systems  Constitutional: Negative.  Negative for chills and fever.  HENT: Negative.  Negative for congestion and sore throat.   Respiratory: Negative.  Negative for cough and shortness of breath.   Cardiovascular: Negative.  Negative for chest pain.  Gastrointestinal: Negative.  Negative for abdominal pain, nausea and vomiting.  Genitourinary: Negative.   Musculoskeletal: Negative.  Negative for back pain, myalgias and neck pain.  Skin: Negative.  Negative for rash.  Neurological: Negative.  Negative for dizziness and headaches.  Endo/Heme/Allergies: Negative.   All other systems reviewed and are negative.  Today's Vitals   11/19/19 0915  BP: 108/73  Pulse: (!) 51  Temp: 98.2 F (36.8 C)  TempSrc: Temporal  Weight: 249 lb (112.9 kg)   Body mass index is 33.77 kg/m.   Physical Exam Vitals reviewed.  Constitutional:      Appearance: Normal appearance.  HENT:     Head: Normocephalic.  Eyes:     Extraocular Movements: Extraocular movements intact.     Pupils: Pupils are equal, round, and reactive to light.  Cardiovascular:     Rate and Rhythm: Normal rate and regular rhythm.  Pulmonary:     Breath sounds: Normal breath sounds.  Abdominal:     Palpations: Abdomen is soft.     Tenderness: There is no abdominal tenderness.  Musculoskeletal:     Cervical back: Normal range of motion and neck supple.  Skin:    General: Skin is warm and dry.     Capillary Refill: Capillary refill takes less than 2 seconds.  Neurological:     General: No focal deficit present.     Mental Status: He is alert and oriented to person, place, and time.  Psychiatric:        Mood and Affect: Mood normal.        Behavior: Behavior normal.      ASSESSMENT & PLAN: Clinically stable.  No medical concerns identified during this  visit.  Joann was seen today for gastric sleeve surgery.  Diagnoses and all orders for this visit:  Class 1 obesity due to excess calories without serious comorbidity with body mass index (BMI) of 33.0 to 33.9 in adult -     CBC with Differential/Platelet -     Comprehensive metabolic panel -  Lipid panel  History of bariatric surgery    Patient Instructions       If you have lab work done today you will be contacted with your lab results within the next 2 weeks.  If you have not heard from [place] then please contact us. The fastest way to get your results is to register for My Chart.   IF you received an x-ray today, you will receive an invoice from [facility]. Please contact [facility] at [phone] with questions or concerns regarding your invoice.   IF you received labwork today, you will receive an invoice from [facility]. Please contact [facility] at [phone] with questions or concerns regarding your invoice.   Our billing staff will not be able to assist you with questions regarding bills from these companies.  You will be contacted with the lab results as soon as they are available. The fastest way to get your results is to activate your My Chart account. Instructions are located on the last page of this paperwork. If you have not heard from [place] regarding the results in 2 weeks, please contact this office.     Mantenimiento de Research officer, political party, Male Adoptar un estilo de vida saludable y recibir atencin preventiva son importantes para promover la salud y Counsellor. Consulte al mdico sobre:  El esquema adecuado para hacerse pruebas y exmenes peridicos.  Cosas que puede hacer por su cuenta para prevenir enfermedades y [place] sano. Qu debo saber sobre la dieta, el peso y el ejercicio? Consuma una dieta saludable   Consuma una dieta que incluya muchas verduras, frutas, productos lcteos con bajo contenido  de [place] y Associate Professor.  No consuma muchos alimentos ricos en grasas slidas, azcares agregados o sodio. Mantenga un peso saludable El ndice de masa muscular [facility]) es una medida que puede utilizarse para identificar posibles problemas de [place]. Proporciona una estimacin de la grasa corporal basndose en el peso y la altura. Su mdico puede ayudarle a Engineer, site IMC y a Personnel officer o Pharmacologist un peso saludable. Haga ejercicio con regularidad Haga ejercicio con regularidad. Esta es una de las prcticas ms importantes que puede hacer por su salud. La mayora de los adultos deben seguir estas pautas:  Education officer, environmental, al menos, de actividad fsica por semana. El ejercicio debe aumentar la frecuencia cardaca y Media planner transpirar (ejercicio de intensidad moderada).  Hacer ejercicios de fortalecimiento por lo [employer] por semana. Agregue esto a su plan de ejercicio de intensidad moderada.  Pasar menos tiempo sentados. Incluso la actividad fsica ligera puede ser beneficiosa. Controle sus niveles de colesterol y lpidos en la sangre Comience a realizarse anlisis de lpidos y Oncologist en la sangre a los 20aos y luego reptalos cada 5aos. Es posible que Insurance underwriter los niveles de colesterol con mayor frecuencia si:  Sus niveles de lpidos y colesterol son altos.  Es mayor de 40aos.  Presenta un alto riesgo de padecer enfermedades cardacas. Qu debo saber sobre las pruebas de deteccin del cncer? Muchos tipos de cncer pueden detectarse de manera temprana y, a menudo, pueden prevenirse. Segn su historia clnica y sus antecedentes familiares, es posible que deba realizarse pruebas de deteccin del cncer en diferentes edades. Esto puede incluir pruebas de deteccin de lo siguiente:  Building services engineer.  Cncer de prstata.  Cncer de piel.  Cncer de pulmn. Qu debo saber sobre la enfermedad cardaca, la diabetes y la hipertensin arterial? Presin  arterial y enfermedad cardaca  La  hipertensin arterial causa enfermedades cardacas y [place] el riesgo de accidente cerebrovascular. Es ms probable que esto se manifieste en las personas que tienen lecturas de presin arterial alta, tienen ascendencia africana o tienen sobrepeso.  Hable con el mdico sobre sus valores de presin arterial deseados.  Hgase controlar la presin arterial: ? Cada 3 a 5 aos si tiene entre 18 y 38 aos. ? Todos los aos si es mayor de [place].  Si tiene entre 65 y 68 aos y es fumador o Insurance underwriter, pregntele al mdico si debe realizarse una prueba de deteccin de aneurisma artico abdominal (AAA) por nica vez. Diabetes Realcese exmenes de deteccin de la diabetes con regularidad. Este anlisis revisa el nivel de azcar en la sangre en [place]. Hgase las pruebas de deteccin:  Cada tresaos despus de los 45aos de edad si tiene un peso normal y un bajo riesgo de padecer diabetes.  Con ms frecuencia y a partir de [place] edad inferior si tiene sobrepeso o un alto riesgo de padecer diabetes. Qu debo saber sobre la prevencin de infecciones? Hepatitis B Si tiene un riesgo ms alto de contraer hepatitis B, debe someterse a un examen de deteccin de este virus. Hable con el mdico para averiguar si tiene riesgo de contraer la infeccin por hepatitis B. Hepatitis C Se recomienda un anlisis de [place] para:  Todos los que nacieron entre 1945 y [phone].  Todas las personas que tengan un riesgo de haber contrado hepatitis C. Enfermedades de transmisin sexual (ETS)  Debe realizarse pruebas de deteccin de ITS todos los aos, incluidas la gonorrea y la clamidia, si: ? Es sexualmente activo y es menor de 24aos. ? Es mayor de 24aos, y Public affairs consultant informa que corre riesgo de tener este tipo de infecciones. ? La actividad sexual ha cambiado desde que le hicieron la ltima prueba de deteccin y tiene un riesgo mayor de Warehouse manager clamidia o Copy. Pregntele al  mdico si usted tiene riesgo.  Pregntele al mdico si usted tiene un alto riesgo de Primary school teacher VIH. El mdico tambin puede recomendarle un medicamento recetado para ayudar a evitar la infeccin por el VIH. Si elige tomar medicamentos para prevenir el VIH, primero debe [employer] de deteccin del VIH. Luego debe hacerse anlisis cada mientras est tomando los medicamentos. Siga estas instrucciones en su casa: Estilo de vida  No consuma ningn producto que contenga nicotina o tabaco, como cigarrillos, cigarrillos electrnicos y tabaco de Theatre manager. Si necesita ayuda para dejar de fumar, consulte al mdico.  No consuma drogas.  No comparta agujas.  Solicite ayuda a su mdico si necesita apoyo o informacin para abandonar las drogas. Consumo de alcohol  No beba alcohol si el mdico se lo prohbe.  Si bebe alcohol: ? Limite la cantidad que consume de 0 a 2 medidas por da. ? Est atento a la cantidad de alcohol que hay en las bebidas que toma. En los [place], una medida equivale a una botella de cerveza de 12oz ( ), un vaso de vino de 5oz ( ) o un vaso de una bebida alcohlica de alta graduacin de 1oz (71ml). Instrucciones generales  Realcese los estudios de rutina de la salud, dentales y de Wellsite geologist.  Mantngase al da con las vacunas.  Infrmele a su mdico si: ? Se siente deprimido con frecuencia. ? Alguna vez ha sido vctima de [place] o no se siente seguro en su casa. Resumen  Adoptar un estilo de vida saludable y recibir atencin preventiva son importantes  para promover la salud y Counsellor.  Siga las instrucciones del mdico acerca de una dieta saludable, el ejercicio y la realizacin de pruebas o exmenes para Hotel manager.  Siga las instrucciones del mdico con respecto al control del colesterol y la presin arterial. Esta informacin no tiene Theme park manager el consejo del mdico. Asegrese de hacerle al mdico cualquier  pregunta que tenga. Document Revised: 10/10/2018 Document Reviewed: 10/10/2018 Elsevier Patient Education  2020 Elsevier Inc.     Edwina Barth, MD [facility]

## 2019-11-20 ENCOUNTER — Encounter: Payer: Self-pay | Admitting: Emergency Medicine

## 2020-03-13 ENCOUNTER — Emergency Department (HOSPITAL_COMMUNITY): Payer: Self-pay

## 2020-03-13 ENCOUNTER — Other Ambulatory Visit: Payer: Self-pay

## 2020-03-13 ENCOUNTER — Encounter (HOSPITAL_COMMUNITY): Payer: Self-pay

## 2020-03-13 ENCOUNTER — Emergency Department (HOSPITAL_COMMUNITY)
Admission: EM | Admit: 2020-03-13 | Discharge: 2020-03-14 | Disposition: A | Discharge status: Home / Self Care | Payer: Self-pay | Attending: Emergency Medicine | Admitting: Emergency Medicine

## 2020-03-13 DIAGNOSIS — K8050 Calculus of bile duct without cholangitis or cholecystitis without obstruction: Secondary | ICD-10-CM

## 2020-03-13 DIAGNOSIS — K8020 Calculus of gallbladder without cholecystitis without obstruction: Secondary | ICD-10-CM | POA: Insufficient documentation

## 2020-03-13 DIAGNOSIS — Z9884 Bariatric surgery status: Secondary | ICD-10-CM | POA: Insufficient documentation

## 2020-03-13 DIAGNOSIS — I10 Essential (primary) hypertension: Secondary | ICD-10-CM | POA: Insufficient documentation

## 2020-03-13 DIAGNOSIS — R001 Bradycardia, unspecified: Secondary | ICD-10-CM | POA: Insufficient documentation

## 2020-03-13 DIAGNOSIS — R112 Nausea with vomiting, unspecified: Secondary | ICD-10-CM | POA: Insufficient documentation

## 2020-03-13 DIAGNOSIS — R1011 Right upper quadrant pain: Secondary | ICD-10-CM | POA: Insufficient documentation

## 2020-03-13 LAB — COMPREHENSIVE METABOLIC PANEL
ALT: 21 U/L (ref 0–44)
AST: 22 U/L (ref 15–41)
Albumin: 4.8 g/dL (ref 3.5–5.0)
Alkaline Phosphatase: 66 U/L (ref 38–126)
Anion gap: 11 (ref 5–15)
BUN: 20 mg/dL (ref 6–20)
CO2: 24 mmol/L (ref 22–32)
Calcium: 9.3 mg/dL (ref 8.9–10.3)
Chloride: 104 mmol/L (ref 98–111)
Creatinine, Ser: 0.63 mg/dL (ref 0.61–1.24)
GFR calc Af Amer: >60 mL/min (ref >60)
GFR calc non Af Amer: >60 mL/min (ref >60)
Glucose, Bld: 132 mg/dL — ABNORMAL HIGH (ref 70–99)
Potassium: 3.8 mmol/L (ref 3.5–5.1)
Sodium: 139 mmol/L (ref 135–145)
Total Bilirubin: 0.9 mg/dL (ref 0.3–1.2)
Total Protein: 7.9 g/dL (ref 6.5–8.1)

## 2020-03-13 LAB — CBC
HCT: 43.1 % (ref 39.0–52.0)
Hemoglobin: 14.5 g/dL (ref 13.0–17.0)
MCH: 31.3 pg (ref 26.0–34.0)
MCHC: 33.6 g/dL (ref 30.0–36.0)
MCV: 92.9 fL (ref 80.0–100.0)
Platelets: 214 10*3/uL (ref 150–400)
RBC: 4.64 MIL/uL (ref 4.22–5.81)
RDW: 12.2 % (ref 11.5–15.5)
WBC: 8.7 10*3/uL (ref 4.0–10.5)
nRBC: 0 % (ref 0.0–0.2)

## 2020-03-13 LAB — LIPASE, BLOOD: Lipase: 52 U/L — ABNORMAL HIGH (ref 11–51)

## 2020-03-13 MED ORDER — SODIUM CHLORIDE 0.9% FLUSH: 3.0000 mL | Freq: Once | INTRAVENOUS | Status: DC

## 2020-03-13 MED ORDER — DICYCLOMINE HCL 10 MG PO CAPS
10.0000 mg | ORAL_CAPSULE | Freq: Once | ORAL | Status: AC
  Administered 2020-03-13: 10 mg via ORAL
  Filled 2020-03-13: qty 1

## 2020-03-13 NOTE — ED Triage Notes (Signed)
Pt reports abdominal pain for the last hour after drinking an iced coffee. Per EMS, every time he burps, his HR drops from 60s-30s. Pt had a gastric sleeve last year and rates pain 10/10.

## 2020-03-13 NOTE — ED Provider Notes (Signed)
[facility]-EMERGENCY DEPT Provider Note   CSN: [id number] Arrival date & time: 03/13/20  2100     History Chief Complaint  Patient presents with  . Abdominal Pain    Manuel Price is a 54 y.o. male with a history of provoked DVT, OSA, morbid obesity s/p gastric sleeve, h/o of HTN, not on antihypertensives who presents to the emergency department with a chief complaint of epigastric pain.  The patient endorses sudden onset, 10/10 epigastric pain that radiates around his bilateral upper abdomen that began suddenly at 17:00.  He characterizes the pain as twisting.  No radiation into his chest, neck, or back.  No history of similar pain.  States that the symptoms began approximately 30 minutes after drinking an iced coffee.  No known aggravating or alleviating factors.  Symptoms were accompanied by increased burping and belching, diaphoresis, nausea, NBNB vomiting x3.  Diaphoresis began approximately 1 hour after onset of pain, and he reports that he was markedly diaphoretic in route with EMS, but this is since subsided.  Pain has persisted, but is currently 5-6/10.  Per EMS, in route, patient was noted to be actively belching and when belching would occur his heart rate would drop from 60s into the 30s.   He denies chest pain, shortness of breath, cough, back pain, fever, chills, headache, dizziness, lightheadedness, dysuria, hematuria, flank pain, palpitations, leg swelling.  States that prior to the onset of symptoms he was feeling well and had no complaints.  Patient had a gastric sleeve performed in [place] in September 2019.  He denies any complications with the surgery.  No other history of abdominal surgeries.  No family history of cardiovascular disease.  He is a former smoker.  His chart indicates a history of hypertension, but he is not currently taking antihypertensives.  The history is provided by the patient and medical records. No language interpreter was  used.       Past Medical History:  Diagnosis Date  . Allergy    SEASONAL  . H/O blood clots 15 years +   RIGHT KNEE AFTER SURGERY NO CAUSE FOUND  . Hypertension   . Lower extremity edema    RIGHT LEG PROPS UP AND GOES DOWN OCC  . Sleep apnea     Patient Active Problem List   Diagnosis Date Noted  . History of hypertension 10/10/2017  . Obstructive sleep apnea 07/12/2013  . Morbid obesity (HCC) 04/30/2013    Past Surgical History:  Procedure Laterality Date  . COLONOSCOPY WITH PROPOFOL N/A 01/30/2018   Procedure: COLONOSCOPY WITH PROPOFOL;  Surgeon: Beverley Fiedler, MD;  Location: [facility];  Service: Gastroenterology;  Laterality: N/A;  . KNEE ARTHROSCOPY Right 20 YRS AGO       Family History  Problem Relation Age of Onset  . Stroke Mother   . Emphysema Maternal Grandmother        non smoker  . Colon cancer Neg Hx   . Esophageal cancer Neg Hx   . Liver cancer Neg Hx   . Pancreatic cancer Neg Hx   . Rectal cancer Neg Hx   . Stomach cancer Neg Hx     Social History   Tobacco Use  . Smoking status: Former Smoker    Packs/day: 0.20    Years: 3.00    Pack years: 0.60    Types: Cigarettes    Quit date: 07/12/1998    Years since quitting: 21.6  . Smokeless tobacco: Never Used  Vaping Use  .  Vaping Use: Never used  Substance Use Topics  . Alcohol use: No    Comment: rarely  . Drug use: No    Home Medications Prior to Admission medications   Medication Sig Start Date End Date Taking? Authorizing Provider  aspirin EC 81 MG tablet Take 81 mg by mouth daily.    [provider]  Cyanocobalamin (VITAMIN B 12 PO) Take 3,000 mg by mouth.    [provider]  Niacin (VITAMIN B-3 PO) Take by mouth daily.    [provider]  ondansetron (ZOFRAN ODT) 4 MG disintegrating tablet Take 1 tablet (4 mg total) by mouth every 8 (eight) hours as needed. 03/14/20   Dannon Perlow A, PA-C  oxyCODONE-acetaminophen (PERCOCET/ROXICET) 5-325 MG tablet Take  1 tablet by mouth every 8 (eight) hours as needed for severe pain. 03/14/20   Evola Hollis A, PA-C    Allergies    Patient has no known allergies.  Review of Systems   Review of Systems  Constitutional: Positive for diaphoresis. Negative for appetite change, chills and fever.  HENT: Negative for congestion and sore throat.   Eyes: Negative for visual disturbance.  Respiratory: Negative for shortness of breath and wheezing.   Cardiovascular: Negative for chest pain, palpitations and leg swelling.  Gastrointestinal: Positive for abdominal pain, nausea and vomiting. Negative for blood in stool, constipation, diarrhea and rectal pain.       +Belching  Genitourinary: Negative for dysuria, flank pain and urgency.  Musculoskeletal: Negative for back pain, joint swelling, myalgias, neck pain and neck stiffness.  Skin: Negative for rash.  Allergic/Immunologic: Negative for immunocompromised state.  Neurological: Negative for dizziness, seizures, syncope, weakness, light-headedness, numbness and headaches.  Psychiatric/Behavioral: Negative for confusion.   Physical Exam Updated Vital Signs BP 104/69   Pulse (!) 55   Temp 97.9 F (36.6 C) (Oral)   Resp 17   SpO2 99%   Physical Exam Vitals and nursing note reviewed.  Constitutional:      General: He is not in acute distress.    Appearance: He is well-developed. He is not ill-appearing, toxic-appearing or diaphoretic.     Comments: No acute distress.  No diaphoresis.  HENT:     Head: Normocephalic.     Mouth/Throat:     Mouth: Mucous membranes are moist.  Eyes:     Extraocular Movements: Extraocular movements intact.     Conjunctiva/sclera: Conjunctivae normal.     Pupils: Pupils are equal, round, and reactive to light.  Cardiovascular:     Rate and Rhythm: Regular rhythm. Bradycardia present.     Pulses: Normal pulses.     Heart sounds: Normal heart sounds. No murmur heard.  No friction rub. No gallop.   Pulmonary:     Effort:  Pulmonary effort is normal. No respiratory distress.     Breath sounds: No stridor. No wheezing, rhonchi or rales.     Comments: Lungs are clear to auscultation bilaterally. Chest:     Chest wall: No tenderness.  Abdominal:     General: There is no distension.     Palpations: Abdomen is soft. There is no mass.     Tenderness: There is abdominal tenderness. There is no right CVA tenderness, left CVA tenderness, guarding or rebound.     Hernia: No hernia is present.     Comments: Well-healed epigastric scar.  Abdomen is soft and nondistended.  There is tenderness to palpation in the epigastric region and in the periumbilical and right lower quadrant.  No  rebound or guarding.  No CVA tenderness bilaterally.  Negative Murphy sign.  No focal tenderness over McBurney's point.  Musculoskeletal:     Cervical back: Normal range of motion and neck supple.     Right lower leg: No edema.     Left lower leg: No edema.  Skin:    General: Skin is warm and dry.     Capillary Refill: Capillary refill takes less than 2 seconds.     Coloration: Skin is not jaundiced or pale.  Neurological:     Mental Status: He is alert.  Psychiatric:        Behavior: Behavior normal.     ED Results / Procedures / Treatments   Labs (all labs ordered are listed, but only abnormal results are displayed) Labs Reviewed  LIPASE, BLOOD - Abnormal; Notable for the following components:      Result Value   Lipase 52 (*)    All other components within normal limits  COMPREHENSIVE METABOLIC PANEL - Abnormal; Notable for the following components:   Glucose, Bld 132 (*)    All other components within normal limits  URINALYSIS, ROUTINE W REFLEX MICROSCOPIC - Abnormal; Notable for the following components:   Specific Gravity, Urine 1.031 (*)    Ketones, ur 20 (*)    Leukocytes,Ua MODERATE (*)    All other components within normal limits  CBC  TROPONIN I (HIGH SENSITIVITY)  TROPONIN I (HIGH SENSITIVITY)    EKG EKG  Interpretation  Date/Time:  Friday March 13 2020 23:19:58 EDT Ventricular Rate:  53 PR Interval:    QRS Duration: 97 QT Interval:  426 QTC Calculation: 400 R Axis:   66 Text Interpretation: Sinus rhythm Confirmed by Nicanor AlconPalumbo, April ([id number]) on 03/14/2020 12:00:06 AM   Radiology DG Chest 2 View  Result Date: 03/13/2020 CLINICAL DATA:  Epigastric pain EXAM: CHEST - 2 VIEW COMPARISON:  April 30, 2013 FINDINGS: The heart size and mediastinal contours are within normal limits. Both lungs are clear. The visualized skeletal structures are unremarkable. IMPRESSION: No active cardiopulmonary disease. Electronically Signed   By: Katherine Mantlehristopher  Green M.D.   On: 03/13/2020 23:15   CT ABDOMEN PELVIS W CONTRAST  Result Date: 03/14/2020 CLINICAL DATA:  Epigastric pain.  History of gastric sleeve. EXAM: CT ABDOMEN AND PELVIS WITH CONTRAST TECHNIQUE: Multidetector CT imaging of the abdomen and pelvis was performed using the standard protocol following bolus administration of intravenous contrast. CONTRAST:  100mL OMNIPAQUE IOHEXOL 300 MG/ML  SOLN COMPARISON:  None. FINDINGS: Lower chest: The lung bases are clear. The heart size is normal. Hepatobiliary: The liver is normal. The gallbladder is significantly distended. There are possible mild adjacent inflammatory changes.There is no biliary ductal dilation. Pancreas: Normal contours without ductal dilatation. No peripancreatic fluid collection. Spleen: Unremarkable. Adrenals/Urinary Tract: --Adrenal glands: Unremarkable. --Right kidney/ureter: No hydronephrosis or radiopaque kidney stones. --Left kidney/ureter: No hydronephrosis or radiopaque kidney stones. --Urinary bladder: There is bladder wall thickening which is felt to be secondary to underdistention. Stomach/Bowel: --Stomach/Duodenum: The patient is status post prior sleeve gastrectomy. There is a small hiatal hernia. There is some wall thickening of the distal esophagus. --Small bowel: Unremarkable. --Colon: There  is an above average amount of stool in the colon. --Appendix: Normal. Vascular/Lymphatic: Normal course and caliber of the major abdominal vessels. --No retroperitoneal lymphadenopathy. --No mesenteric lymphadenopathy. --No pelvic or inguinal lymphadenopathy. Reproductive: Unremarkable Other: No ascites or free air. There is a small fat containing left inguinal hernia. Musculoskeletal. No acute displaced fractures. IMPRESSION: 1. The gallbladder  is significantly distended. There are possible mild adjacent inflammatory changes. If there is concern for acute cholecystitis, recommend further evaluation with right upper quadrant ultrasound. 2. Status post sleeve gastrectomy with a small hiatal hernia. There is some wall thickening of the distal esophagus. This may be secondary to esophagitis. 3. There is an above average amount of stool in the colon. 4. Bladder wall thickening is felt to be secondary to underdistention. Correlation with urinalysis is recommended. Electronically Signed   By: Constance Holster M.D.   On: 03/14/2020 00:48   US Abdomen Limited RUQ  Result Date: 03/14/2020 CLINICAL DATA:  Right upper quadrant pain EXAM: ULTRASOUND ABDOMEN LIMITED RIGHT UPPER QUADRANT COMPARISON:  None. FINDINGS: Gallbladder: Numerous layering gallstones within the gallbladder. No wall thickening or sonographic Murphy's sign. Gallbladder is moderately distended. Common bile duct: Diameter: Normal caliber, 4 mm. Liver: No focal lesion identified. Within normal limits in parenchymal echogenicity. Portal vein is patent on color Doppler imaging with normal direction of blood flow towards the liver. Other: None. IMPRESSION: Cholelithiasis. Gallbladder is distended, but no definite sonographic changes of acute cholecystitis. Electronically Signed   By: Rolm Baptise M.D.   On: 03/14/2020 02:03    Procedures Procedures (including critical care time)  Medications Ordered in ED Medications  sodium chloride flush (NS) 0.9 %  injection 3 mL (0 mLs Intravenous Hold 03/14/20 0127)  dicyclomine (BENTYL) capsule 10 mg (10 mg Oral Given 03/13/20 2325)  iohexol (OMNIPAQUE) 300 MG/ML solution 100 mL (100 mLs Intravenous Contrast Given 03/14/20 0029)  cefTRIAXone (ROCEPHIN) 2 g in sodium chloride 0.9 % 100 mL IVPB (0 g Intravenous Stopped 03/14/20 0154)    ED Course  I have reviewed the triage vital signs and the nursing notes.  Pertinent labs & imaging results that were available during my care of the patient were reviewed by me and considered in my medical decision making (see chart for details).    MDM Rules/Calculators/A&P                          54 year old male with a history of provoked DVT, OSA, morbid obesity s/p gastric sleeve, h/o of HTN, not on antihypertensives presenting by EMS with epigastric abdominal pain accompanied by nausea, vomiting, and diaphoresis.  EMS noted in route that during episodes of bulging his heart rate would decrease from 60 to approximately 30.  He is having no chest pain or shortness of breath and epigastric pain has been steadily improving without treatment since onset.  There is a history of hypertension in his chart, but patient is not currently on antihypertensive medications.  He has a history of sleep apnea.  He underwent a gastric sleeve in [place] in September 2019.  No notable family history of CAD.  However, given the patient's age and risk factors with presenting symptoms, will check an EKG, troponin, and a chest x-ray.  On exam, he has tenderness to palpation in the epigastric region as well as the right lower quadrant.  Labs are overall reassuring, but given his surgical history, will order CT abdomen pelvis.   EKG with normal sinus rhythm.  Delta troponin is negative.  No evidence of symptomatic bradycardia in the ER.  Low suspicion for ACS. HEAR score is 3.  No electrolyte derangements.  Transaminases are normal.  Bilirubin and alkaline phosphatase are normal.  Lipase is  unremarkable.  CT with significant distention of the gallbladder as well as some possible mild adjacent inflammatory changes.  There is a small hiatal hernia and some wall thickening of the distal esophagus thought to be secondary to esophagitis.  He is constipated and there is bladder wall thickening felt to be secondary to under distention.  Will order Rocephin while right upper quadrant ultrasound is pending.  He does have pyuria, but given that he has no urinary complaints.  He was given Rocephin in the ER given concern for cholecystitis, but no further outpatient antibiotics are indicated at this time.  Right upper quadrant ultrasound with moderate gallbladder distention and cholelithiasis, but negative sonographic Murphy sign.  Common bile duct is not dilated.  No wall thickening or pericholecystic fluid.  On reevaluation, patient's pain is currently 1 out of 10.  He has been successfully fluid challenge.  In the setting of normal labs and patient being pain-free, suspect biliary colic and not cholecystitis, choledocholithiasis, or ascending cholangitis.  Will discharge patient home with gallbladder eating plan, general surgery referral, Zofran, and Percocet if biliary colic returns.  ER return precautions given.  He is hemodynamically stable and in no acute distress.  Safe for discharge home with outpatient follow-up as indicated.  Final Clinical Impression(s) / ED Diagnoses Final diagnoses:  RUQ abdominal pain  Biliary colic    Rx / DC Orders ED Discharge Orders         Ordered    oxyCODONE-acetaminophen (PERCOCET/ROXICET) 5-325 MG tablet  Every 8 hours PRN     Discontinue  Reprint     03/14/20 0236    ondansetron (ZOFRAN ODT) 4 MG disintegrating tablet  Every 8 hours PRN     Discontinue  Reprint     03/14/20 0236           Cherith Tewell A, PA-C 03/14/20 0248    Palumbo, April, MD 03/14/20 0301

## 2020-03-14 ENCOUNTER — Emergency Department (HOSPITAL_COMMUNITY): Payer: Self-pay

## 2020-03-14 ENCOUNTER — Encounter (HOSPITAL_COMMUNITY): Payer: Self-pay

## 2020-03-14 LAB — URINALYSIS, ROUTINE W REFLEX MICROSCOPIC
Bacteria, UA: NONE SEEN
Bilirubin Urine: NEGATIVE
Glucose, UA: NEGATIVE mg/dL
Hgb urine dipstick: NEGATIVE
Ketones, ur: 20 mg/dL — AB
Nitrite: NEGATIVE
Protein, ur: NEGATIVE mg/dL
Specific Gravity, Urine: 1.031 — ABNORMAL HIGH (ref 1.005–1.030)
pH: 5 (ref 5.0–8.0)

## 2020-03-14 LAB — TROPONIN I (HIGH SENSITIVITY)
Troponin I (High Sensitivity): <2 ng/L (ref <18)
Troponin I (High Sensitivity): <2 ng/L (ref <18)

## 2020-03-14 MED ORDER — ONDANSETRON 4 MG PO TBDP: 4.0000 mg | ORAL_TABLET | Freq: Three times a day (TID) | ORAL | 0 refills | Status: DC | PRN

## 2020-03-14 MED ORDER — SODIUM CHLORIDE 0.9 % IV SOLN
2.0000 g | Freq: Once | INTRAVENOUS | Status: AC
  Administered 2020-03-14: 2 g via INTRAVENOUS
  Filled 2020-03-14: qty 20

## 2020-03-14 MED ORDER — OXYCODONE-ACETAMINOPHEN 5-325 MG PO TABS: 1.0000 | ORAL_TABLET | Freq: Three times a day (TID) | ORAL | 0 refills | Status: DC | PRN

## 2020-03-14 MED ORDER — IOHEXOL 300 MG/ML  SOLN
100.0000 mL | Freq: Once | INTRAMUSCULAR | Status: AC | PRN
  Administered 2020-03-14: 100 mL via INTRAVENOUS

## 2020-03-14 MED ORDER — SODIUM CHLORIDE (PF) 0.9 % IJ SOLN
INTRAMUSCULAR | Status: AC
  Filled 2020-03-14: qty 50

## 2020-03-14 NOTE — ED Notes (Signed)
Patient successfully  Completed PO challenge

## 2020-03-14 NOTE — ED Notes (Signed)
US at bedside

## 2020-03-14 NOTE — Discharge Instructions (Signed)
Thank you for allowing me to care for you today in the Emergency Department.   I have attached instructions for foods that may help to prevent recurrence of the pain and symptoms you had tonight.  Eating foods that are higher in fat may cause her symptoms to recur.  Please call the general surgeon's office to schedule a follow-up appointment.  Although there was no infection of your gallbladder tonight, you may still have to have it removed.  Let 1 tablet of Zofran dissolve in your tongue every 8 hours as needed for nausea or vomiting.  You can take 650 mg of Tylenol if your pain returns.  For severe, uncontrollable pain, try taking 1 tablet of Percocet no more than once every 8 hours.  It is a narcotic.  It can be addicting.  You should not work or drive while taking this medication.  It should only be used for severe pain.  You should return to the emergency department if you develop severe, uncontrollable abdominal pain, persistent vomiting despite taking Zofran, abdominal pain with a high fever, or other new, concerning symptoms.  Gracias por permitirme atenderlo [employer].  Adjunto instrucciones sobre alimentos que pueden ayudar a Research scientist (medical) recurrencia del dolor y los sntomas que tuvo esta noche. Comer alimentos con alto contenido de grasas puede hacer que sus sntomas reaparezcan.  Llame al consultorio del cirujano general para programar una cita de seguimiento. Aunque esta noche no hubo ninguna infeccin en la vescula biliar, es posible que deba extirparla.  Deje que 1 tableta de Zofran se disuelva en su lengua cada 8 horas segn sea necesario para las nuseas o los vmitos.  Puede tomar 650 mg de Tylenol si su dolor regresa. Para el dolor intenso e incontrolable, intente tomar 1 tableta de Percocet no ms de una vez cada 8 horas. Es un narctico. Secretary/administrator. No debe trabajar ni conducir mientras toma este medicamento. Solo debe usarse para el  dolor intenso.  Debe regresar al departamento de emergencias si presenta dolor abdominal intenso e incontrolable, vmitos persistentes a pesar de tomar Zofran, dolor abdominal con fiebre alta u otros sntomas nuevos que le preocupen.

## 2020-03-17 ENCOUNTER — Encounter: Payer: Self-pay | Admitting: Emergency Medicine

## 2020-06-20 ENCOUNTER — Other Ambulatory Visit: Payer: Self-pay

## 2020-06-20 ENCOUNTER — Ambulatory Visit
Admission: EM | Admit: 2020-06-20 | Discharge: 2020-06-20 | Disposition: A | Discharge status: Home / Self Care | Payer: Self-pay | Attending: Physician Assistant | Admitting: Physician Assistant

## 2020-06-20 DIAGNOSIS — L237 Allergic contact dermatitis due to plants, except food: Secondary | ICD-10-CM

## 2020-06-20 MED ORDER — DESONIDE 0.05 % EX CREA
TOPICAL_CREAM | CUTANEOUS | 0 refills | Status: DC
Start: 2020-06-20 — End: 2020-09-03

## 2020-06-20 MED ORDER — DEXAMETHASONE SODIUM PHOSPHATE 10 MG/ML IJ SOLN
10.0000 mg | Freq: Once | INTRAMUSCULAR | Status: AC
  Administered 2020-06-20: 10 mg via INTRAMUSCULAR

## 2020-06-20 MED ORDER — PREDNISONE 50 MG PO TABS: 50.0000 mg | ORAL_TABLET | Freq: Every day | ORAL | 0 refills | Status: DC

## 2020-06-20 NOTE — Discharge Instructions (Signed)
Decadron injection given in office today.  He can use desonide cream sparingly to affected area, make sure not to get too close to the eye.  Ice compress for itching.  If symptoms not improving in 3 days, can start prednisone as directed.  Monitor for signs of infection including spreading redness, increased warmth, significant pain, fever.

## 2020-06-20 NOTE — ED Triage Notes (Signed)
Pt presents with a rash on left side of face that he reports started yesterday.

## 2020-06-20 NOTE — ED Provider Notes (Signed)
[name]    CSN: [id number] Arrival date & time: 06/20/20  0801      History   Chief Complaint Chief Complaint  Patient presents with  . Rash    face    HPI Manuel Price is a 54 y.o. male.   54 year old male comes in for 2-day history of rash to the face.  First started to the left side of the face, now to the right as well as the forehead.  Area is itching without pain/burning.  Denies fever, warmth. Got in contact with poison ivy.      Past Medical History:  Diagnosis Date  . Allergy    SEASONAL  . H/O blood clots 15 years +   RIGHT KNEE AFTER SURGERY NO CAUSE FOUND  . Hypertension   . Lower extremity edema    RIGHT LEG PROPS UP AND GOES DOWN OCC  . Sleep apnea     Patient Active Problem List   Diagnosis Date Noted  . History of hypertension 10/10/2017  . Obstructive sleep apnea 07/12/2013  . Morbid obesity (HCC) 04/30/2013    Past Surgical History:  Procedure Laterality Date  . COLONOSCOPY WITH PROPOFOL N/A 01/30/2018   Procedure: COLONOSCOPY WITH PROPOFOL;  Surgeon: Beverley Fiedler, MD;  Location: [facility];  Service: Gastroenterology;  Laterality: N/A;  . KNEE ARTHROSCOPY Right 20 YRS AGO       Home Medications    Prior to Admission medications   Medication Sig Start Date End Date Taking? Authorizing Provider  desonide (DESOWEN) 0.05 % cream Use twice daily sparingly to the face 06/20/20   Belinda Fisher, PA-C  predniSONE (DELTASONE) 50 MG tablet Take 1 tablet (50 mg total) by mouth daily with breakfast. 06/20/20   Belinda Fisher, PA-C    Family History Family History  Problem Relation Age of Onset  . Stroke Mother   . Emphysema Maternal Grandmother        non smoker  . Colon cancer Neg Hx   . Esophageal cancer Neg Hx   . Liver cancer Neg Hx   . Pancreatic cancer Neg Hx   . Rectal cancer Neg Hx   . Stomach cancer Neg Hx     Social History Social History   Tobacco Use  . Smoking status: Former Smoker    Packs/day: 0.20     Years: 3.00    Pack years: 0.60    Types: Cigarettes    Quit date: 07/12/1998    Years since quitting: 21.9  . Smokeless tobacco: Never Used  Vaping Use  . Vaping Use: Never used  Substance Use Topics  . Alcohol use: No    Comment: rarely  . Drug use: No     Allergies   Patient has no known allergies.   Review of Systems Review of Systems  Reason unable to perform ROS: See HPI as above.     Physical Exam Triage Vital Signs ED Triage Vitals [06/20/20 0828]  Enc Vitals Group     BP      Pulse      Resp      Temp      Temp src      SpO2      Weight      Height      Head Circumference      Peak Flow      Pain Score 0     Pain Loc      Pain Edu?  Excl. in GC?    No data found.  Updated Vital Signs BP 131/84 (BP Location: Left Arm)   Pulse (!) 46   Temp 98 F (36.7 C)   Resp 14   SpO2 98%   Physical Exam Constitutional:      General: He is not in acute distress.    Appearance: Normal appearance. He is well-developed. He is not toxic-appearing or diaphoretic.  HENT:     Head: Normocephalic and atraumatic.      Comments: Rash located as shown on picture. Skin thickening with erythema, few vesicular/maculopapular rash. No warmth, tenderness.  Eyes:     Conjunctiva/sclera: Conjunctivae normal.     Pupils: Pupils are equal, round, and reactive to light.  Pulmonary:     Effort: Pulmonary effort is normal. No respiratory distress.  Musculoskeletal:     Cervical back: Normal range of motion and neck supple.  Skin:    General: Skin is warm and dry.  Neurological:     Mental Status: He is alert and oriented to person, place, and time.      UC Treatments / Results  Labs (all labs ordered are listed, but only abnormal results are displayed) Labs Reviewed - No data to display  EKG   Radiology No results found.  Procedures Procedures (including critical care time)  Medications Ordered in UC Medications  dexamethasone (DECADRON) injection 10  mg (10 mg Intramuscular Given 06/20/20 0850)    Initial Impression / Assessment and Plan / UC Course  I have reviewed the triage vital signs and the nursing notes.  Pertinent labs & imaging results that were available during my care of the patient were reviewed by me and considered in my medical decision making (see chart for details).    History and exam consistent with poison ivy dermatitis.  Patient requesting corticosteroid injection versus p.o.  Will provide Decadron injection in office today.  Desonide as needed sparingly to the face.  Will provide written Rx of prednisone, can start if symptoms not improving.  Return precautions given.  Final Clinical Impressions(s) / UC Diagnoses   Final diagnoses:  Poison ivy dermatitis    ED Prescriptions    Medication Sig Dispense Auth. Provider   desonide (DESOWEN) 0.05 % cream Use twice daily sparingly to the face 15 g [place] Pechacek V, PA-C   predniSONE (DELTASONE) 50 MG tablet Take 1 tablet (50 mg total) by mouth daily with breakfast. 5 tablet Belinda Fisher, PA-C     PDMP not reviewed this encounter.   Belinda Fisher, PA-C 06/20/20 [phone]

## 2020-09-03 ENCOUNTER — Ambulatory Visit
Admission: EM | Admit: 2020-09-03 | Discharge: 2020-09-03 | Disposition: A | Discharge status: Home / Self Care | Payer: Self-pay | Attending: Emergency Medicine | Admitting: Emergency Medicine

## 2020-09-03 ENCOUNTER — Other Ambulatory Visit: Payer: Self-pay

## 2020-09-03 DIAGNOSIS — Z20822 Contact with and (suspected) exposure to covid-19: Secondary | ICD-10-CM

## 2020-09-03 DIAGNOSIS — J069 Acute upper respiratory infection, unspecified: Secondary | ICD-10-CM

## 2020-09-03 MED ORDER — DM-GUAIFENESIN ER 30-600 MG PO TB12
1.0000 | ORAL_TABLET | Freq: Two times a day (BID) | ORAL | 0 refills | Status: DC
Start: 2020-09-03 — End: 2022-02-16

## 2020-09-03 MED ORDER — CETIRIZINE HCL 10 MG PO CAPS
10.0000 mg | ORAL_CAPSULE | Freq: Every day | ORAL | 0 refills | Status: DC
Start: 2020-09-03 — End: 2022-02-16

## 2020-09-03 MED ORDER — BENZONATATE 200 MG PO CAPS
200.0000 mg | ORAL_CAPSULE | Freq: Three times a day (TID) | ORAL | 0 refills | Status: AC | PRN
Start: 2020-09-03 — End: 2020-09-10

## 2020-09-03 NOTE — Discharge Instructions (Signed)
Covid test pending, monitor my chart for results Begin daily cetirizine/Zyrtec or loratadine/Claritin to help with postnasal drainage Mucinex DM twice daily to further help with congestion and cough May use benzonatate/Tessalon every 8 hours as needed for cough Honey and hot tea with lemon/ginger may help with hoarseness Rest and fluids Follow-up if not improving or worsening

## 2020-09-03 NOTE — ED Triage Notes (Signed)
Pt c/o sore throat, productive cough with green/brown sputum, and nasal congestion since Monday. States fully vaccinated and has had flu shot.

## 2020-09-03 NOTE — ED Provider Notes (Signed)
[name]    CSN: [id number] Arrival date & time: 09/03/20  0804      History   Chief Complaint Chief Complaint  Patient presents with  . Sore Throat    HPI Manuel Price is a 54 y.o. male history of OSA, hypertension, presenting today for evaluation of URI symptoms.  Reports sore throat cough and congestion.  Symptoms began Monday and have been present over the past 4 days.  Main concern is postnasal drainage and mucus in his throat.  Cough is mild.  Denies rhinorrhea or sore throat.  Cough is becoming slightly becoming hoarse.  Denies any fevers chills or body aches.  HPI  Past Medical History:  Diagnosis Date  . Allergy    SEASONAL  . H/O blood clots 15 years +   RIGHT KNEE AFTER SURGERY NO CAUSE FOUND  . Hypertension   . Lower extremity edema    RIGHT LEG PROPS UP AND GOES DOWN OCC  . Sleep apnea     Patient Active Problem List   Diagnosis Date Noted  . History of hypertension 10/10/2017  . Obstructive sleep apnea 07/12/2013  . Morbid obesity (HCC) 04/30/2013    Past Surgical History:  Procedure Laterality Date  . COLONOSCOPY WITH PROPOFOL N/A 01/30/2018   Procedure: COLONOSCOPY WITH PROPOFOL;  Surgeon: Beverley Fiedler, MD;  Location: [facility];  Service: Gastroenterology;  Laterality: N/A;  . KNEE ARTHROSCOPY Right 20 YRS AGO       Home Medications    Prior to Admission medications   Medication Sig Start Date End Date Taking? Authorizing Provider  benzonatate (TESSALON) 200 MG capsule Take 1 capsule (200 mg total) by mouth 3 (three) times daily as needed for up to 7 days for cough. 09/03/20 09/10/20  Kyliyah Stirn C, PA-C  Cetirizine HCl 10 MG CAPS Take 1 capsule (10 mg total) by mouth daily for 10 days. 09/03/20 09/13/20  Erlene Devita C, PA-C  dextromethorphan-guaiFENesin (MUCINEX DM) 30-600 MG 12hr tablet Take 1 tablet by mouth 2 (two) times daily. 09/03/20   Earlee Herald, Junius Creamer, PA-C    Family History Family History  Problem  Relation Age of Onset  . Stroke Mother   . Emphysema Maternal Grandmother        non smoker  . Colon cancer Neg Hx   . Esophageal cancer Neg Hx   . Liver cancer Neg Hx   . Pancreatic cancer Neg Hx   . Rectal cancer Neg Hx   . Stomach cancer Neg Hx     Social History Social History   Tobacco Use  . Smoking status: Former Smoker    Packs/day: 0.20    Years: 3.00    Pack years: 0.60    Types: Cigarettes    Quit date: 07/12/1998    Years since quitting: 22.1  . Smokeless tobacco: Never Used  Vaping Use  . Vaping Use: Never used  Substance Use Topics  . Alcohol use: No    Comment: rarely  . Drug use: No     Allergies   Patient has no known allergies.   Review of Systems Review of Systems  Constitutional: Negative for activity change, appetite change, chills, fatigue and fever.  HENT: Positive for congestion and rhinorrhea. Negative for ear pain, sinus pressure, sore throat and trouble swallowing.   Eyes: Negative for discharge and redness.  Respiratory: Positive for cough. Negative for chest tightness and shortness of breath.   Cardiovascular: Negative for chest pain.  Gastrointestinal: Negative for abdominal  pain, diarrhea, nausea and vomiting.  Musculoskeletal: Negative for myalgias.  Skin: Negative for rash.  Neurological: Negative for dizziness, light-headedness and headaches.     Physical Exam Triage Vital Signs ED Triage Vitals  Enc Vitals Group     BP 09/03/20 0815 135/88     Pulse Rate 09/03/20 0815 60     Resp 09/03/20 0815 18     Temp 09/03/20 0815 98.4 F (36.9 C)     Temp Source 09/03/20 0815 Oral     SpO2 09/03/20 0815 96 %     Weight --      Height --      Head Circumference --      Peak Flow --      Pain Score 09/03/20 0816 0     Pain Loc --      Pain Edu? --      Excl. in GC? --    No data found.  Updated Vital Signs BP 135/88 (BP Location: Left Arm)   Pulse 60   Temp 98.4 F (36.9 C) (Oral)   Resp 18   SpO2 96%   Visual  Acuity Right Eye Distance:   Left Eye Distance:   Bilateral Distance:    Right Eye Near:   Left Eye Near:    Bilateral Near:     Physical Exam Vitals and nursing note reviewed.  Constitutional:      Appearance: He is well-developed.     Comments: No acute distress  HENT:     Head: Normocephalic and atraumatic.     Ears:     Comments: Bilateral ears without tenderness to palpation of external auricle, tragus and mastoid, EAC's without erythema or swelling, TM's with good bony landmarks and cone of light. Non erythematous.     Nose: Nose normal.     Mouth/Throat:     Comments: Oral mucosa pink and moist, no tonsillar enlargement or exudate. Posterior pharynx patent and nonerythematous, no uvula deviation or swelling. Normal phonation. Eyes:     Conjunctiva/sclera: Conjunctivae normal.  Cardiovascular:     Rate and Rhythm: Normal rate.  Pulmonary:     Effort: Pulmonary effort is normal. No respiratory distress.     Comments: Breathing comfortably at rest, CTABL, no wheezing, rales or other adventitious sounds auscultated Abdominal:     General: There is no distension.  Musculoskeletal:        General: Normal range of motion.     Cervical back: Neck supple.  Skin:    General: Skin is warm and dry.  Neurological:     Mental Status: He is alert and oriented to person, place, and time.      UC Treatments / Results  Labs (all labs ordered are listed, but only abnormal results are displayed) Labs Reviewed  NOVEL CORONAVIRUS, NAA    EKG   Radiology No results found.  Procedures Procedures (including critical care time)  Medications Ordered in UC Medications - No data to display  Initial Impression / Assessment and Plan / UC Course  I have reviewed the triage vital signs and the nursing notes.  Pertinent labs & imaging results that were available during my care of the patient were reviewed by me and considered in my medical decision making (see chart for  details).     Suspect likely viral URI with cough-Covid test pending, recommending symptomatic and supportive care to help with postnasal drainage and mucus.  Rest and fluids.  Continue to monitor,Discussed strict return precautions. Patient verbalized  understanding and is agreeable with plan.  Final Clinical Impressions(s) / UC Diagnoses   Final diagnoses:  Encounter for screening laboratory testing for COVID-19 virus  Viral URI with cough     Discharge Instructions     Covid test pending, monitor my chart for results Begin daily cetirizine/Zyrtec or loratadine/Claritin to help with postnasal drainage Mucinex DM twice daily to further help with congestion and cough May use benzonatate/Tessalon every 8 hours as needed for cough Honey and hot tea with lemon/ginger may help with hoarseness Rest and fluids Follow-up if not improving or worsening    ED Prescriptions    Medication Sig Dispense Auth. Provider   Cetirizine HCl 10 MG CAPS Take 1 capsule (10 mg total) by mouth daily for 10 days. 10 capsule Zyron Deeley C, PA-C   dextromethorphan-guaiFENesin (MUCINEX DM) 30-600 MG 12hr tablet Take 1 tablet by mouth 2 (two) times daily. 20 tablet Azra Abrell C, PA-C   benzonatate (TESSALON) 200 MG capsule Take 1 capsule (200 mg total) by mouth 3 (three) times daily as needed for up to 7 days for cough. 28 capsule Alexa Blish, [place] C, PA-C     PDMP not reviewed this encounter.   Lew Dawes, [place] 09/03/20 [phone]

## 2020-09-04 LAB — SARS-COV-2, NAA 2 DAY TAT

## 2020-09-04 LAB — NOVEL CORONAVIRUS, NAA: SARS-CoV-2, NAA: NOT DETECTED

## 2021-11-21 ENCOUNTER — Ambulatory Visit
Admission: EM | Admit: 2021-11-21 | Discharge: 2021-11-21 | Disposition: A | Discharge status: Home / Self Care | Payer: Self-pay | Attending: Physician Assistant | Admitting: Physician Assistant

## 2021-11-21 ENCOUNTER — Encounter: Payer: Self-pay | Admitting: Emergency Medicine

## 2021-11-21 ENCOUNTER — Other Ambulatory Visit: Payer: Self-pay

## 2021-11-21 DIAGNOSIS — J209 Acute bronchitis, unspecified: Secondary | ICD-10-CM

## 2021-11-21 MED ORDER — PREDNISONE 20 MG PO TABS: 40.0000 mg | ORAL_TABLET | Freq: Every day | ORAL | 0 refills | Status: AC

## 2021-11-21 NOTE — ED Triage Notes (Signed)
Pt here for cough and congestion x 2 weeks 

## 2021-11-21 NOTE — Discharge Instructions (Signed)
°  Try Mucinex to help with mucus.

## 2021-11-21 NOTE — ED Provider Notes (Signed)
[name]    CSN: [id number] Arrival date & time: 11/21/21  0920      History   Chief Complaint Chief Complaint  Patient presents with   Cough    HPI Manuel Price is a 56 y.o. male.   Patient here today for evaluation of cough and congestion he has had last 2 weeks.  He states that cough has been more productive this morning with more mucus.  He feels as if he needs to get rid of mucus and would like recommendation for medication today same.  He has not had fever.  He denies any sore throat.  He has tried cough syrup without significant relief.  The history is provided by the patient.  Cough Associated symptoms: no chills, no ear pain, no eye discharge, no fever, no shortness of breath and no sore throat    Past Medical History:  Diagnosis Date   Allergy    SEASONAL   H/O blood clots 15 years +   RIGHT KNEE AFTER SURGERY NO CAUSE FOUND   Hypertension    Lower extremity edema    RIGHT LEG PROPS UP AND GOES DOWN OCC   Sleep apnea     Patient Active Problem List   Diagnosis Date Noted   History of hypertension 10/10/2017   Obstructive sleep apnea 07/12/2013   Morbid obesity (HCC) 04/30/2013    Past Surgical History:  Procedure Laterality Date   COLONOSCOPY WITH PROPOFOL N/A 01/30/2018   Procedure: COLONOSCOPY WITH PROPOFOL;  Surgeon: Beverley Fiedler, MD;  Location: [facility];  Service: Gastroenterology;  Laterality: N/A;   KNEE ARTHROSCOPY Right 20 YRS AGO       Home Medications    Prior to Admission medications   Medication Sig Start Date End Date Taking? Authorizing Provider  predniSONE (DELTASONE) 20 MG tablet Take 2 tablets (40 mg total) by mouth daily with breakfast for 5 days. 11/21/21 11/26/21 Yes Tomi Bamberger, PA-C  Cetirizine HCl 10 MG CAPS Take 1 capsule (10 mg total) by mouth daily for 10 days. 09/03/20 09/13/20  Wieters, Hallie C, PA-C  dextromethorphan-guaiFENesin (MUCINEX DM) 30-600 MG 12hr tablet Take 1 tablet by mouth 2  (two) times daily. 09/03/20   Wieters, Junius Creamer, PA-C    Family History Family History  Problem Relation Age of Onset   Stroke Mother    Emphysema Maternal Grandmother        non smoker   Colon cancer Neg Hx    Esophageal cancer Neg Hx    Liver cancer Neg Hx    Pancreatic cancer Neg Hx    Rectal cancer Neg Hx    Stomach cancer Neg Hx     Social History Social History   Tobacco Use   Smoking status: Former    Packs/day: 0.20    Years: 3.00    Pack years: 0.60    Types: Cigarettes    Quit date: 07/12/1998    Years since quitting: 23.3   Smokeless tobacco: Never  Vaping Use   Vaping Use: Never used  Substance Use Topics   Alcohol use: No    Comment: rarely   Drug use: No     Allergies   Patient has no known allergies.   Review of Systems Review of Systems  Constitutional:  Negative for chills and fever.  HENT:  Positive for congestion. Negative for ear pain and sore throat.   Eyes:  Negative for discharge and redness.  Respiratory:  Positive for cough. Negative for  shortness of breath.   Gastrointestinal:  Negative for abdominal pain, nausea and vomiting.    Physical Exam Triage Vital Signs ED Triage Vitals [11/21/21 1037]  Enc Vitals Group     BP 137/86     Pulse Rate 60     Resp 18     Temp 98.6 F (37 C)     Temp Source Oral     SpO2 96 %     Weight      Height      Head Circumference      Peak Flow      Pain Score 0     Pain Loc      Pain Edu?      Excl. in GC?    No data found.  Updated Vital Signs BP 137/86 (BP Location: Left Arm)    Pulse 60    Temp 98.6 F (37 C) (Oral)    Resp 18    SpO2 96%      Physical Exam Vitals and nursing note reviewed.  Constitutional:      General: He is not in acute distress.    Appearance: Normal appearance. He is not ill-appearing.  HENT:     Head: Normocephalic and atraumatic.     Nose: Nose normal. No congestion.  Eyes:     Conjunctiva/sclera: Conjunctivae normal.  Cardiovascular:     Rate and  Rhythm: Normal rate and regular rhythm.     Heart sounds: Normal heart sounds. No murmur heard. Pulmonary:     Effort: Pulmonary effort is normal. No respiratory distress.     Breath sounds: Normal breath sounds. No wheezing, rhonchi or rales.  Skin:    General: Skin is warm and dry.  Neurological:     Mental Status: He is alert.  Psychiatric:        Mood and Affect: Mood normal.        Thought Content: Thought content normal.     UC Treatments / Results  Labs (all labs ordered are listed, but only abnormal results are displayed) Labs Reviewed - No data to display  EKG   Radiology No results found.  Procedures Procedures (including critical care time)  Medications Ordered in UC Medications - No data to display  Initial Impression / Assessment and Plan / UC Course  I have reviewed the triage vital signs and the nursing notes.  Pertinent labs & imaging results that were available during my care of the patient were reviewed by me and considered in my medical decision making (see chart for details).  Steroid burst prescribed for bronchitis and recommended Mucinex to help with mucus.  Encouraged follow-up with any further concerns.   Final Clinical Impressions(s) / UC Diagnoses   Final diagnoses:  Acute bronchitis, unspecified organism     Discharge Instructions       Try Mucinex to help with mucus.        ED Prescriptions     Medication Sig Dispense Auth. Provider   predniSONE (DELTASONE) 20 MG tablet Take 2 tablets (40 mg total) by mouth daily with breakfast for 5 days. 10 tablet Tomi Bamberger, PA-C      PDMP not reviewed this encounter.   Tomi Bamberger, PA-C 11/21/21 1103

## 2021-11-24 ENCOUNTER — Ambulatory Visit
Admission: EM | Admit: 2021-11-24 | Discharge: 2021-11-24 | Disposition: A | Discharge status: Home / Self Care | Payer: Self-pay | Attending: Internal Medicine | Admitting: Internal Medicine

## 2021-11-24 ENCOUNTER — Other Ambulatory Visit: Payer: Self-pay

## 2021-11-24 ENCOUNTER — Ambulatory Visit (INDEPENDENT_AMBULATORY_CARE_PROVIDER_SITE_OTHER): Payer: Self-pay

## 2021-11-24 DIAGNOSIS — R059 Cough, unspecified: Secondary | ICD-10-CM

## 2021-11-24 DIAGNOSIS — J209 Acute bronchitis, unspecified: Secondary | ICD-10-CM

## 2021-11-24 MED ORDER — DOXYCYCLINE HYCLATE 100 MG PO CAPS: 100.0000 mg | ORAL_CAPSULE | Freq: Two times a day (BID) | ORAL | 0 refills | Status: DC

## 2021-11-24 NOTE — ED Provider Notes (Signed)
[facility]    CSN: [id number] Arrival date & time: 11/24/21  1705      History   Chief Complaint Chief Complaint  Patient presents with   Cough    HPI Manuel Price is a 56 y.o. male.   Patient here today for evaluation of continued cough despite steroid therapy that was prescribed at last office visit.  He states he has been day left of steroid burst but cough seems to be worsening.  He has been using Mucinex as well without significant relief.  He has not had fever.  The history is provided by the patient.   Past Medical History:  Diagnosis Date   Allergy    SEASONAL   H/O blood clots 15 years +   RIGHT KNEE AFTER SURGERY NO CAUSE FOUND   Hypertension    Lower extremity edema    RIGHT LEG PROPS UP AND GOES DOWN OCC   Sleep apnea     Patient Active Problem List   Diagnosis Date Noted   History of hypertension 10/10/2017   Obstructive sleep apnea 07/12/2013   Morbid obesity ([place]) 04/30/2013    Past Surgical History:  Procedure Laterality Date   COLONOSCOPY WITH PROPOFOL N/A 01/30/2018   Procedure: COLONOSCOPY WITH PROPOFOL;  Surgeon: Jerene Bears, MD;  Location: [facility];  Service: Gastroenterology;  Laterality: N/A;   KNEE ARTHROSCOPY Right 20 YRS AGO       Home Medications    Prior to Admission medications   Medication Sig Start Date End Date Taking? Authorizing Provider  doxycycline (VIBRAMYCIN) 100 MG capsule Take 1 capsule (100 mg total) by mouth 2 (two) times daily. 11/24/21  Yes Francene Finders, PA-C  Cetirizine HCl 10 MG CAPS Take 1 capsule (10 mg total) by mouth daily for 10 days. 09/03/20 09/13/20  Wieters, Hallie C, PA-C  dextromethorphan-guaiFENesin (MUCINEX DM) 30-600 MG 12hr tablet Take 1 tablet by mouth 2 (two) times daily. 09/03/20   Wieters, Hallie C, PA-C  predniSONE (DELTASONE) 20 MG tablet Take 2 tablets (40 mg total) by mouth daily with breakfast for 5 days. 11/21/21 11/26/21  Francene Finders, PA-C    Family  History Family History  Problem Relation Age of Onset   Stroke Mother    Emphysema Maternal Grandmother        non smoker   Colon cancer Neg Hx    Esophageal cancer Neg Hx    Liver cancer Neg Hx    Pancreatic cancer Neg Hx    Rectal cancer Neg Hx    Stomach cancer Neg Hx     Social History Social History   Tobacco Use   Smoking status: Former    Packs/day: 0.20    Years: 3.00    Pack years: 0.60    Types: Cigarettes    Quit date: 07/12/1998    Years since quitting: 23.3   Smokeless tobacco: Never  Vaping Use   Vaping Use: Never used  Substance Use Topics   Alcohol use: No    Comment: rarely   Drug use: No     Allergies   Patient has no known allergies.   Review of Systems Review of Systems  Constitutional:  Negative for chills and fever.  HENT:  Positive for congestion. Negative for ear pain and sore throat.   Eyes:  Negative for discharge and redness.  Respiratory:  Positive for cough. Negative for shortness of breath.   Gastrointestinal:  Negative for abdominal pain, nausea and vomiting.  Physical Exam Triage Vital Signs ED Triage Vitals  Enc Vitals Group     BP      Pulse      Resp      Temp      Temp src      SpO2      Weight      Height      Head Circumference      Peak Flow      Pain Score      Pain Loc      Pain Edu?      Excl. in GC?    No data found.  Updated Vital Signs BP 130/85 (BP Location: Right Arm)    Pulse 63    Temp 99.1 F (37.3 C) (Oral)    Resp 18    SpO2 96%      Physical Exam Vitals and nursing note reviewed.  Constitutional:      General: He is not in acute distress.    Appearance: Normal appearance. He is not ill-appearing.  HENT:     Head: Normocephalic and atraumatic.  Eyes:     Conjunctiva/sclera: Conjunctivae normal.  Cardiovascular:     Rate and Rhythm: Normal rate and regular rhythm.     Heart sounds: Normal heart sounds. No murmur heard. Pulmonary:     Effort: Pulmonary effort is normal. No  respiratory distress.     Breath sounds: Rhonchi (minimal rhonchi to right lower lung base) present. No wheezing or rales.  Skin:    General: Skin is warm and dry.  Neurological:     Mental Status: He is alert.  Psychiatric:        Mood and Affect: Mood normal.        Thought Content: Thought content normal.     UC Treatments / Results  Labs (all labs ordered are listed, but only abnormal results are displayed) Labs Reviewed - No data to display  EKG   Radiology DG Chest 2 View  Result Date: 11/24/2021 CLINICAL DATA:  Cough for few days, no improvement with steroids EXAM: CHEST - 2 VIEW COMPARISON:  03/13/2020 FINDINGS: Normal heart size, mediastinal contours, and pulmonary vascularity. Peribronchial thickening with increased markings at RIGHT lung base versus previous exam question atelectasis versus infiltrate. Remaining lungs clear. No pleural effusion or pneumothorax. Osseous structures unremarkable. IMPRESSION: Bronchitic changes with increased markings at RIGHT base question atelectasis versus infiltrate. Electronically Signed   By: Ulyses Southward M.D.   On: 11/24/2021 17:48    Procedures Procedures (including critical care time)  Medications Ordered in UC Medications - No data to display  Initial Impression / Assessment and Plan / UC Course  I have reviewed the triage vital signs and the nursing notes.  Pertinent labs & imaging results that were available during my care of the patient were reviewed by me and considered in my medical decision making (see chart for details).    CXR confirms bronchitis like changes but mentions some possible right base infiltrate-- will treat with doxycycline and will continue steroid treatment as previously prescribed. Encouraged follow up with any further concerns.   Final Clinical Impressions(s) / UC Diagnoses   Final diagnoses:  Acute bronchitis, unspecified organism   Discharge Instructions   None    ED Prescriptions      Medication Sig Dispense Auth. Provider   doxycycline (VIBRAMYCIN) 100 MG capsule Take 1 capsule (100 mg total) by mouth 2 (two) times daily. 20 capsule Tomi Bamberger, PA-C  PDMP not reviewed this encounter.   Francene Finders, PA-C 11/24/21 [phone]

## 2021-11-24 NOTE — ED Triage Notes (Signed)
Pt c/o cough states was here a few days ago and given steroids. Is currently taking steroids but not seeing improvement.

## 2021-12-01 ENCOUNTER — Telehealth: Payer: Self-pay | Admitting: Emergency Medicine

## 2021-12-01 ENCOUNTER — Ambulatory Visit (INDEPENDENT_AMBULATORY_CARE_PROVIDER_SITE_OTHER): Payer: Self-pay

## 2021-12-01 ENCOUNTER — Encounter: Payer: Self-pay | Admitting: Emergency Medicine

## 2021-12-01 ENCOUNTER — Ambulatory Visit (INDEPENDENT_AMBULATORY_CARE_PROVIDER_SITE_OTHER): Payer: Self-pay | Admitting: Emergency Medicine

## 2021-12-01 ENCOUNTER — Other Ambulatory Visit: Payer: Self-pay

## 2021-12-01 VITALS — BP 118/60 | HR 73 | Temp 98.6°F | Ht 72.0 in | Wt 243.0 lb

## 2021-12-01 DIAGNOSIS — J22 Unspecified acute lower respiratory infection: Secondary | ICD-10-CM

## 2021-12-01 DIAGNOSIS — R051 Acute cough: Secondary | ICD-10-CM

## 2021-12-01 DIAGNOSIS — I10 Essential (primary) hypertension: Secondary | ICD-10-CM

## 2021-12-01 LAB — COMPREHENSIVE METABOLIC PANEL
ALT: 15 U/L (ref 0–53)
AST: 20 U/L (ref 0–37)
Albumin: 4.2 g/dL (ref 3.5–5.2)
Alkaline Phosphatase: 57 U/L (ref 39–117)
BUN: 18 mg/dL (ref 6–23)
CO2: 29 mEq/L (ref 19–32)
Calcium: 9.3 mg/dL (ref 8.4–10.5)
Chloride: 102 mEq/L (ref 96–112)
Creatinine, Ser: 0.86 mg/dL (ref 0.40–1.50)
GFR: 96.19 mL/min (ref >60.00)
Glucose, Bld: 88 mg/dL (ref 70–99)
Potassium: 4.1 mEq/L (ref 3.5–5.1)
Sodium: 137 mEq/L (ref 135–145)
Total Bilirubin: 0.8 mg/dL (ref 0.2–1.2)
Total Protein: 7.1 g/dL (ref 6.0–8.3)

## 2021-12-01 LAB — CBC WITH DIFFERENTIAL/PLATELET
Basophils Absolute: 0 10*3/uL (ref 0.0–0.1)
Basophils Relative: 0.4 % (ref 0.0–3.0)
Eosinophils Absolute: 0.1 10*3/uL (ref 0.0–0.7)
Eosinophils Relative: 1.4 % (ref 0.0–5.0)
HCT: 43.3 % (ref 39.0–52.0)
Hemoglobin: 14.6 g/dL (ref 13.0–17.0)
Lymphocytes Relative: 32.3 % (ref 12.0–46.0)
Lymphs Abs: 2.7 10*3/uL (ref 0.7–4.0)
MCHC: 33.8 g/dL (ref 30.0–36.0)
MCV: 92.1 fl (ref 78.0–100.0)
Monocytes Absolute: 0.5 10*3/uL (ref 0.1–1.0)
Monocytes Relative: 5.7 % (ref 3.0–12.0)
Neutro Abs: 5 10*3/uL (ref 1.4–7.7)
Neutrophils Relative %: 60.2 % (ref 43.0–77.0)
Platelets: 195 10*3/uL (ref 150.0–400.0)
RBC: 4.7 Mil/uL (ref 4.22–5.81)
RDW: 13.1 % (ref 11.5–15.5)
WBC: 8.2 10*3/uL (ref 4.0–10.5)

## 2021-12-01 LAB — LIPID PANEL
Cholesterol: 165 mg/dL (ref 0–200)
HDL: 72.2 mg/dL (ref >39.00)
LDL Cholesterol: 79 mg/dL (ref 0–99)
NonHDL: 93.16
Total CHOL/HDL Ratio: 2
Triglycerides: 73 mg/dL (ref 0.0–149.0)
VLDL: 14.6 mg/dL (ref 0.0–40.0)

## 2021-12-01 LAB — HEMOGLOBIN A1C: Hgb A1c MFr Bld: 5.3 % (ref 4.6–6.5)

## 2021-12-01 MED ORDER — AZITHROMYCIN 250 MG PO TABS: ORAL_TABLET | ORAL | 0 refills | Status: DC

## 2021-12-01 NOTE — Telephone Encounter (Signed)
Chest x-ray report and blood work results discussed with patient. ?

## 2021-12-01 NOTE — Patient Instructions (Signed)
Tos en los adultos Cough, Adult La tos ayuda a despejar la garganta y los pulmones. La tos puede ser un signode una enfermedad u otra afeccin mdica. Una tos aguda puede durar entre 2 o 3 semanas, mientras que una tos crnicapuede durar 8 semanas o ms tiempo. Hay muchas cosas que pueden causar tos. Estas incluyen lo siguiente: Grmenes (virus o bacterias) que atacan las vas respiratorias. Inhalacin de cosas que alteran (irritan) los pulmones. Alergias. Asma. Mucosidad que se desliza por la parte posterior de la garganta (goteo posnasal). Fumar. cido que vuelve desde el estmago hacia el tubo que transporta los alimentos desde la boca hasta el estmago (reflujo gastroesofgico). Algunos medicamentos. Problemas pulmonares. Otras enfermedades, como insuficiencia cardaca o un cogulo de sangre en el pulmn (embolia pulmonar). Siga estas instrucciones en su casa: Medicamentos Tome los medicamentos de venta libre y los recetados solamente como se lo haya indicado el mdico. Hable con el mdico antes de tomar medicamentos que detienen la tos (antitusivos). Estilo de vida  No fume y trate de no estar cerca de humo. No consuma ningn producto que contenga nicotina o tabaco, como cigarrillos, cigarrillos electrnicos y tabaco de mascar. Si necesita ayuda para dejar de fumar, consulte al mdico. Beba suficiente lquido para mantener el pis (la orina) de color amarillo plido. Evite la cafena. No beba alcohol si el mdico se lo prohbe.  Instrucciones generales  Fjese si hay algn cambio en la tos. Informe a su mdico sobre ellos. Siempre cbrase la boca al toser. Aljese de las cosas que lo hagan toser, como perfumes, velas, humo de fogatas o productos de limpieza. Si el aire es muy seco, use un humidificador o un vaporizador de niebla fra en su hogar. Si la tos empeora por la noche, pruebe con usar almohadas adicionales para elevar la cabeza mientras duerme. Descanse todo lo que sea  necesario. Concurra a todas las visitas de seguimiento como se lo haya indicado el mdico. Esto es importante.  Comunquese con un mdico si: Aparecen nuevos sntomas. Tose y escupe pus. La tos no mejora despus de 2 o 3 semanas o empeora. Los medicamentos para la tos no le alivian la tos y usted no duerme bien. Siente un dolor que empeora o un dolor que no se alivia con medicamentos. Tiene fiebre. Est adelgazando y no sabe por qu. Transpira durante la noche. Solicite ayuda inmediatamente si: Tose y escupe sangre. Tiene dificultad para respirar. El corazn le late muy rpido. Estos sntomas pueden indicar una emergencia. No espere a ver si los sntomas desaparecen. Solicite atencin mdica de inmediato. Comunquese con el servicio de emergencias de su localidad (911 en los [place]). No conduzca por sus propios medios hasta el hospital. Resumen La tos ayuda a despejar la garganta y los pulmones. Hay muchas cosas que pueden causar tos. Tome los medicamentos de venta libre y los recetados solamente como se lo haya indicado el mdico. Siempre cbrase la boca al toser. Comunquese con un mdico si tiene sntomas nuevos o tiene una tos que no mejora o que empeora. Esta informacin no tiene como fin reemplazar el consejo del mdico. Asegresede hacerle al mdico cualquier pregunta que tenga. Document Revised: 11/14/2018 Document Reviewed: 11/14/2018 Elsevier Patient Education  2022 [employer]  

## 2021-12-01 NOTE — Progress Notes (Signed)
Manuel GreathouseFrancisco Price 56 y.o.   Chief Complaint  Patient presents with   Cough    HISTORY OF PRESENT ILLNESS: Acute problem visit today. This is a 56 y.o. male complaining of persisting cough that started about 3 to 4 weeks ago. Went to urgent care clinic, had chest x-ray done.  Went back a second time and was started on doxycycline. Last couple of days cough has turned productive of a green sputum. Denies fever or chills.  Denies difficulty breathing or wheezing.  No other significant symptoms. No other complaints or medical concerns today. Chest x-ray done on 11/24/2021 as follows: IMPRESSION: Bronchitic changes with increased markings at RIGHT base question atelectasis versus infiltrate.     Electronically Signed   By: Ulyses SouthwardMark  Boles M.D.   On: 11/24/2021 17:48  Cough Pertinent negatives include no chest pain, chills, fever, hemoptysis, rash, sore throat, shortness of breath or wheezing.    Prior to Admission medications   Medication Sig Start Date End Date Taking? Authorizing Provider  azithromycin (ZITHROMAX) 250 MG tablet Sig as indicated 12/01/21  Yes [place] Dube, Eilleen KempfMiguel Jose, MD  doxycycline (VIBRAMYCIN) 100 MG capsule Take 1 capsule (100 mg total) by mouth 2 (two) times daily. 11/24/21  Yes Tomi BambergerMyers, Rebecca F, PA-C  Cetirizine HCl 10 MG CAPS Take 1 capsule (10 mg total) by mouth daily for 10 days. 09/03/20 09/13/20  Wieters, Hallie C, PA-C  dextromethorphan-guaiFENesin (MUCINEX DM) 30-600 MG 12hr tablet Take 1 tablet by mouth 2 (two) times daily. Patient not taking: Reported on 12/01/2021 09/03/20   Patterson HammersmithWieters, Hallie C, PA-C    No Known Allergies  Patient Active Problem List   Diagnosis Date Noted   History of hypertension 10/10/2017   Obstructive sleep apnea 07/12/2013   Morbid obesity (HCC) 04/30/2013    Past Medical History:  Diagnosis Date   Allergy    SEASONAL   H/O blood clots 15 years +   RIGHT KNEE AFTER SURGERY NO CAUSE FOUND   Hypertension    Lower extremity edema     RIGHT LEG PROPS UP AND GOES DOWN OCC   Sleep apnea     Past Surgical History:  Procedure Laterality Date   COLONOSCOPY WITH PROPOFOL N/A 01/30/2018   Procedure: COLONOSCOPY WITH PROPOFOL;  Surgeon: Beverley FiedlerPyrtle, Jay M, MD;  Location: [facility];  Service: Gastroenterology;  Laterality: N/A;   KNEE ARTHROSCOPY Right 20 YRS AGO    Social History   Socioeconomic History   Marital status: Married    Spouse name: Not on file   Number of children: 3   Years of education: Not on file   Highest education level: Not on file  Occupational History   Occupation: Therapist, sportsconstruction    Employer: [employer]  Tobacco Use   Smoking status: Former    Packs/day: 0.20    Years: 3.00    Pack years: 0.60    Types: Cigarettes    Quit date: 07/12/1998    Years since quitting: 23.4   Smokeless tobacco: Never  Vaping Use   Vaping Use: Never used  Substance and Sexual Activity   Alcohol use: No    Comment: rarely   Drug use: No   Sexual activity: Not on file  Other Topics Concern   Not on file  Social History Narrative   Not on file   Social Determinants of Health   Financial Resource Strain: Not on file  Food Insecurity: Not on file  Transportation Needs: Not on file  Physical Activity: Not on file  Stress: Not on file  Social Connections: Not on file  Intimate Partner Violence: Not on file    Family History  Problem Relation Age of Onset   Stroke Mother    Emphysema Maternal Grandmother        non smoker   Colon cancer Neg Hx    Esophageal cancer Neg Hx    Liver cancer Neg Hx    Pancreatic cancer Neg Hx    Rectal cancer Neg Hx    Stomach cancer Neg Hx      Review of Systems  Constitutional: Negative.  Negative for chills and fever.  HENT: Negative.  Negative for congestion and sore throat.   Respiratory:  Positive for cough and sputum production. Negative for hemoptysis, shortness of breath and wheezing.   Cardiovascular: Negative.  Negative for chest pain and  palpitations.  Gastrointestinal:  Negative for abdominal pain, nausea and vomiting.  Skin: Negative.  Negative for rash.  All other systems reviewed and are negative. Today's Vitals   12/01/21 1111  BP: 118/60  Pulse: 73  Temp: 98.6 F (37 C)  TempSrc: Oral  SpO2: 90%  Weight: 243 lb (110.2 kg)  Height: 6' (1.829 m)   Body mass index is 32.96 kg/m.   Physical Exam Vitals reviewed.  Constitutional:      Appearance: Normal appearance.  HENT:     Head: Normocephalic.     Mouth/Throat:     Mouth: Mucous membranes are moist.     Pharynx: Oropharynx is clear.  Eyes:     Extraocular Movements: Extraocular movements intact.     Pupils: Pupils are equal, round, and reactive to light.  Cardiovascular:     Rate and Rhythm: Normal rate and regular rhythm.     Pulses: Normal pulses.     Heart sounds: Normal heart sounds.  Pulmonary:     Effort: Pulmonary effort is normal.     Breath sounds: Rhonchi present.  Musculoskeletal:        General: Normal range of motion.     Cervical back: No tenderness.  Lymphadenopathy:     Cervical: No cervical adenopathy.  Skin:    General: Skin is warm and dry.     Capillary Refill: Capillary refill takes less than 2 seconds.  Neurological:     General: No focal deficit present.     Mental Status: He is alert and oriented to person, place, and time.  Psychiatric:        Mood and Affect: Mood normal.        Behavior: Behavior normal.   DG Chest 2 View  Result Date: 12/01/2021 CLINICAL DATA:  Productive cough for 3 weeks.  History of smoking. EXAM: CHEST - 2 VIEW COMPARISON:  11/24/2021 FINDINGS: The cardiomediastinal silhouette is unchanged with normal heart size. The interstitial markings remain increased in the right lung base, similar to the prior study and greater than a baseline examination from 2021. Minimal scarring is noted more laterally in the right lung base. The lungs are otherwise clear. No pleural effusion or pneumothorax is  identified. No acute osseous abnormality is seen. IMPRESSION: Persistently increased markings in the right lung base which could reflect atelectasis versus infection. Electronically Signed   By: Sebastian Ache M.D.   On: 12/01/2021 12:05     ASSESSMENT & PLAN: Clinically stable.  Stable vital signs.  No red flag signs or symptoms. Chest x-ray was suggestive of early pneumonia on 11/24/2021.  Was started on doxycycline. Still symptomatic.  Partially treated  infection. We will repeat chest x-ray today.  We will start azithromycin for 5 days. Symptomatic cough treatment. Blood work done today.  Advised to rest and stay well-hydrated. Follow-up in the office in the next few days if no better or worse.  Problem List Items Addressed This Visit   None Visit Diagnoses     Acute cough    -  Primary   Relevant Orders   DG Chest 2 View   Lower respiratory infection       Relevant Medications   azithromycin (ZITHROMAX) 250 MG tablet   Other Relevant Orders   CBC with Differential/Platelet   Essential hypertension       Relevant Orders   Comprehensive metabolic panel   Hemoglobin A1c   Lipid panel      Patient Instructions  Tos en los adultos Cough, Adult La tos ayuda a despejar la garganta y los pulmones. La tos puede ser un signo de una enfermedad u otra afeccin mdica. Una tos aguda puede durar [place] 2 o 3 semanas, mientras que una tos crnica puede durar 8 semanas o ms [employer]. Hay muchas cosas que pueden causar tos. Estas incluyen lo siguiente: Grmenes (virus o bacterias) que atacan las vas respiratorias. Inhalacin de cosas que alteran (irritan) los pulmones. Alergias. Asma. Mucosidad que se desliza por la parte posterior de la garganta (goteo posnasal). Fumar. cido que vuelve desde el estmago hacia el tubo que transporta los alimentos desde la boca hasta el estmago (reflujo gastroesofgico). Algunos medicamentos. Problemas pulmonares. Otras enfermedades, como insuficiencia  cardaca o un cogulo de sangre en el pulmn (embolia pulmonar). Siga estas instrucciones en su casa: Medicamentos Tome los medicamentos de venta libre y los recetados solamente como se lo haya indicado el mdico. Hable con el mdico antes de tomar medicamentos que detienen la tos (antitusivos). Estilo de vida  No fume y trate de no estar cerca de humo. No consuma ningn producto que contenga nicotina o tabaco, como cigarrillos, cigarrillos electrnicos y tabaco de Theatre manager. Si necesita ayuda para dejar de fumar, consulte al mdico. Beba suficiente lquido para mantener el pis (la [place]) de color amarillo plido. Evite la cafena. No beba alcohol si el mdico se lo prohbe. Instrucciones generales  Fjese si hay algn cambio en la tos. Informe a su mdico sobre ellos. Siempre cbrase la boca al toser. Aljese de las cosas que lo hagan toser, como perfumes, velas, humo de fogatas o productos de limpieza. Si el aire es [employer], use un humidificador o un vaporizador de niebla fra en su hogar. Si la tos empeora por la noche, pruebe con usar almohadas adicionales para elevar la cabeza mientras duerme. Descanse todo lo que sea necesario. Concurra a todas las visitas de [address] se lo haya indicado el mdico. Esto es importante. Comunquese con un mdico si: Aparecen nuevos sntomas. Tose y escupe pus. La tos no mejora despus de 2 o 3 semanas o empeora. Los medicamentos para la tos no [employer] tos y usted no duerme bien. Siente un dolor que empeora o un dolor que no se alivia con medicamentos. Tiene fiebre. Est adelgazando y no sabe por qu. Transpira durante la noche. Solicite ayuda inmediatamente si: Tose y [employer]. Tiene dificultad para respirar. [employer] late [place] rpido. Estos sntomas pueden Customer service manager. No espere a ver si los sntomas desaparecen. Solicite atencin mdica de inmediato. Comunquese con el servicio de emergencias de su localidad (911  en los [place]). No conduzca por  sus propios medios [employer]. Resumen La tos ayuda a despejar la garganta y los pulmones. Hay muchas cosas que pueden causar tos. Tome los medicamentos de venta libre y los recetados solamente como se lo haya indicado el mdico. Siempre cbrase la boca al toser. Comunquese con un mdico si tiene sntomas nuevos o tiene una tos que no mejora o que [place]. Esta informacin no tiene Theme park manager el consejo del mdico. Asegrese de hacerle al mdico cualquier pregunta que tenga. Document Revised: 11/14/2018 Document Reviewed: 11/14/2018 Elsevier Patient Education  2022 Elsevier Inc.     Edwina Barth, MD [facility]

## 2022-02-16 ENCOUNTER — Ambulatory Visit
Admission: EM | Admit: 2022-02-16 | Discharge: 2022-02-16 | Disposition: A | Discharge status: Home / Self Care | Payer: Self-pay | Attending: Urgent Care | Admitting: Urgent Care

## 2022-02-16 DIAGNOSIS — R0981 Nasal congestion: Secondary | ICD-10-CM

## 2022-02-16 DIAGNOSIS — J3489 Other specified disorders of nose and nasal sinuses: Secondary | ICD-10-CM

## 2022-02-16 DIAGNOSIS — H6993 Unspecified Eustachian tube disorder, bilateral: Secondary | ICD-10-CM

## 2022-02-16 MED ORDER — FEXOFENADINE-PSEUDOEPHED ER 60-120 MG PO TB12: 1.0000 | ORAL_TABLET | Freq: Two times a day (BID) | ORAL | 0 refills | Status: AC

## 2022-02-16 MED ORDER — MONTELUKAST SODIUM 10 MG PO TABS: 10.0000 mg | ORAL_TABLET | Freq: Every day | ORAL | 0 refills | Status: DC

## 2022-02-16 MED ORDER — FLUTICASONE PROPIONATE 50 MCG/ACT NA SUSP: 1.0000 | Freq: Two times a day (BID) | NASAL | 0 refills | Status: DC

## 2022-02-16 NOTE — Discharge Instructions (Addendum)
You have a middle ear effusion, which is fluid behind the ear drums. ?This is not an infection. ?Please start taking the Allegra-D in the morning for as short a time as needed. Monitor your blood pressure on sudafed. You can use the Flonase 1 spray twice a day until symptoms resolve.  Please stop if nasal bleeding occurs. ?Read the attached handouts to perform a sinus rinse. ? ? ?

## 2022-02-16 NOTE — ED Triage Notes (Signed)
Pt c/o changes in voice, nasal congestion, ear pressure  ? ?Denies cough, sore throat, ear ache ? ?Onset ~ 6 days. Says tried allegra otc yesterday with minimal relief.  ?

## 2022-02-16 NOTE — ED Provider Notes (Signed)
[name]    CSN: [id number] Arrival date & time: 02/16/22  1756      History   Chief Complaint Chief Complaint  Patient presents with   nasal congesiton    HPI Manuel Price is a 56 y.o. male.   Pleasant 56yo male presents with concern of nasal congestion x 1 week and pressure in bilateral ears. He denies any additional uri sx specifically sinus pain/tenderness, sore throat, fever, headache or cough. He states his ears do not hurt, they just feel like they pop and can hear fluid. Tried allegra which helped relieve symptoms for two hours. Ran out of this medication and wanted advice. Has not tried any nasal sprays.     Past Medical History:  Diagnosis Date   Allergy    SEASONAL   H/O blood clots 15 years +   RIGHT KNEE AFTER SURGERY NO CAUSE FOUND   Hypertension    Lower extremity edema    RIGHT LEG PROPS UP AND GOES DOWN OCC   Sleep apnea     Patient Active Problem List   Diagnosis Date Noted   History of hypertension 10/10/2017   Obstructive sleep apnea 07/12/2013   Morbid obesity ([place]) 04/30/2013    Past Surgical History:  Procedure Laterality Date   COLONOSCOPY WITH PROPOFOL N/A 01/30/2018   Procedure: COLONOSCOPY WITH PROPOFOL;  Surgeon: Jerene Bears, MD;  Location: [facility];  Service: Gastroenterology;  Laterality: N/A;   KNEE ARTHROSCOPY Right 20 YRS AGO       Home Medications    Prior to Admission medications   Medication Sig Start Date End Date Taking? Authorizing Provider  fexofenadine-pseudoephedrine (ALLEGRA-D) 60-120 MG 12 hr tablet Take 1 tablet by mouth 2 (two) times daily for 10 days. 02/16/22 02/26/22 Yes Laela Deviney L, PA  fluticasone (FLONASE) 50 MCG/ACT nasal spray Place 1 spray into both nostrils in the morning and at bedtime. 02/16/22  Yes Venida Tsukamoto L, PA  montelukast (SINGULAIR) 10 MG tablet Take 1 tablet (10 mg total) by mouth at bedtime. 02/16/22  Yes Omer Puccinelli L, PA    Family History Family  History  Problem Relation Age of Onset   Stroke Mother    Emphysema Maternal Grandmother        non smoker   Colon cancer Neg Hx    Esophageal cancer Neg Hx    Liver cancer Neg Hx    Pancreatic cancer Neg Hx    Rectal cancer Neg Hx    Stomach cancer Neg Hx     Social History Social History   Tobacco Use   Smoking status: Former    Packs/day: 0.20    Years: 3.00    Pack years: 0.60    Types: Cigarettes    Quit date: 07/12/1998    Years since quitting: 23.6   Smokeless tobacco: Never  Vaping Use   Vaping Use: Never used  Substance Use Topics   Alcohol use: No    Comment: rarely   Drug use: No     Allergies   Patient has no known allergies.   Review of Systems Review of Systems As per hpi  Physical Exam Triage Vital Signs ED Triage Vitals [02/16/22 1809]  Enc Vitals Group     BP 123/80     Pulse      Resp 18     Temp 97.8 F (36.6 C)     Temp Source Oral     SpO2 98 %     Weight  Height      Head Circumference      Peak Flow      Pain Score 0     Pain Loc      Pain Edu?      Excl. in [place]?    No data found.  Updated Vital Signs BP 123/80 (BP Location: Left Arm)   Temp 97.8 F (36.6 C) (Oral)   Resp 18   SpO2 98%   Visual Acuity Right Eye Distance:   Left Eye Distance:   Bilateral Distance:    Right Eye Near:   Left Eye Near:    Bilateral Near:     Physical Exam Vitals and nursing note reviewed.  Constitutional:      General: He is not in acute distress.    Appearance: Normal appearance. He is well-developed. He is not ill-appearing, toxic-appearing or diaphoretic.  HENT:     Head: Normocephalic and atraumatic.     Jaw: There is normal jaw occlusion.     Salivary Glands: Right salivary gland is not diffusely enlarged or tender. Left salivary gland is not diffusely enlarged or tender.     Right Ear: Ear canal and external ear normal. A middle ear effusion is present. Tympanic membrane is not injected, scarred, perforated,  erythematous, retracted or bulging.     Left Ear: Ear canal and external ear normal. A middle ear effusion is present. Tympanic membrane is not injected, scarred, perforated, erythematous, retracted or bulging.     Nose: Congestion and rhinorrhea present. No nasal tenderness. Rhinorrhea is clear.     Right Nostril: No epistaxis.     Left Nostril: No epistaxis.     Right Turbinates: Swollen. Not enlarged.     Left Turbinates: Swollen. Not enlarged.     Right Sinus: No maxillary sinus tenderness or frontal sinus tenderness.     Left Sinus: No maxillary sinus tenderness or frontal sinus tenderness.     Mouth/Throat:     Lips: Pink.     Mouth: Mucous membranes are moist. No injury, lacerations, oral lesions or angioedema.     Dentition: Normal dentition.     Tongue: No lesions. Tongue does not deviate from midline.     Palate: No mass and lesions.     Pharynx: Oropharynx is clear. Uvula midline. No pharyngeal swelling, oropharyngeal exudate, posterior oropharyngeal erythema or uvula swelling.     Tonsils: No tonsillar exudate or tonsillar abscesses.     Comments: Posterior pharynx lymphoid hyperplasia Eyes:     General: Lids are normal. No scleral icterus.       Right eye: No discharge.        Left eye: No discharge.     Conjunctiva/sclera: Conjunctivae normal.     Right eye: Right conjunctiva is not injected.     Left eye: Left conjunctiva is not injected.  Cardiovascular:     Rate and Rhythm: Normal rate and regular rhythm.     Heart sounds: No murmur heard. Pulmonary:     Effort: Pulmonary effort is normal. No respiratory distress.     Breath sounds: Normal breath sounds. No stridor. No wheezing, rhonchi or rales.  Chest:     Chest wall: No tenderness.  Musculoskeletal:        General: No swelling.     Cervical back: Normal range of motion and neck supple.  Lymphadenopathy:     Cervical: No cervical adenopathy.  Skin:    General: Skin is warm and dry.  Capillary Refill:  Capillary refill takes less than 2 seconds.     Findings: No rash.  Neurological:     Mental Status: He is alert.  Psychiatric:        Mood and Affect: Mood normal.     UC Treatments / Results  Labs (all labs ordered are listed, but only abnormal results are displayed) Labs Reviewed - No data to display  EKG   Radiology No results found.  Procedures Procedures (including critical care time)  Medications Ordered in UC Medications - No data to display  Initial Impression / Assessment and Plan / UC Course  I have reviewed the triage vital signs and the nursing notes.  Pertinent labs & imaging results that were available during my care of the patient were reviewed by me and considered in my medical decision making (see chart for details).     Nasal congestion with rhinorrhea - likely allergen induced. Will restart allegra-D, but only temporarily with the decongestant given hx of HTN. BP perfectly controlled in office. Once sx relieved, switch to plain allegra without sudafed. Short course of montelukast as well, at night. Flonase PRN ETD - secondary to nasal congestion. No evidence of infectious cause. Tx as above  Final Clinical Impressions(s) / UC Diagnoses   Final diagnoses:  Nasal congestion with rhinorrhea  Eustachian tube disorder, bilateral     Discharge Instructions      You have a middle ear effusion, which is fluid behind the ear drums. This is not an infection. Please start taking the Allegra-D in the morning for as short a time as needed. Monitor your blood pressure on sudafed. You can use the Flonase 1 spray twice a day until symptoms resolve.  Please stop if nasal bleeding occurs. Read the attached handouts to perform a sinus rinse.     ED Prescriptions     Medication Sig Dispense Auth. Provider   montelukast (SINGULAIR) 10 MG tablet Take 1 tablet (10 mg total) by mouth at bedtime. 30 tablet Ranisha Allaire L, PA   fluticasone (FLONASE) 50 MCG/ACT  nasal spray Place 1 spray into both nostrils in the morning and at bedtime. 16 mL Jori Frerichs L, PA   fexofenadine-pseudoephedrine (ALLEGRA-D) 60-120 MG 12 hr tablet Take 1 tablet by mouth 2 (two) times daily for 10 days. 20 tablet Merl Bommarito L, [place]      PDMP not reviewed this encounter.   Chaney Malling, [place] 02/18/22 2220

## 2023-04-26 ENCOUNTER — Encounter: Payer: Self-pay | Admitting: Emergency Medicine

## 2023-04-26 ENCOUNTER — Ambulatory Visit: Payer: Self-pay | Admitting: Emergency Medicine

## 2023-04-26 VITALS — BP 128/76 | HR 60 | Temp 98.4°F | Ht 72.0 in | Wt 258.2 lb

## 2023-04-26 DIAGNOSIS — Z114 Encounter for screening for human immunodeficiency virus [HIV]: Secondary | ICD-10-CM

## 2023-04-26 DIAGNOSIS — Z13228 Encounter for screening for other metabolic disorders: Secondary | ICD-10-CM

## 2023-04-26 DIAGNOSIS — Z1159 Encounter for screening for other viral diseases: Secondary | ICD-10-CM

## 2023-04-26 DIAGNOSIS — Z130 Encounter for screening for diseases of the blood and blood-forming organs and certain disorders involving the immune mechanism: Secondary | ICD-10-CM

## 2023-04-26 DIAGNOSIS — Z0000 Encounter for general adult medical examination without abnormal findings: Secondary | ICD-10-CM

## 2023-04-26 DIAGNOSIS — Z1329 Encounter for screening for other suspected endocrine disorder: Secondary | ICD-10-CM

## 2023-04-26 DIAGNOSIS — Z13220 Encounter for screening for lipoid disorders: Secondary | ICD-10-CM

## 2023-04-26 LAB — CBC WITH DIFFERENTIAL/PLATELET
Basophils Absolute: 0 10*3/uL (ref 0.0–0.1)
Basophils Relative: 1 % (ref 0.0–3.0)
Eosinophils Absolute: 0.1 10*3/uL (ref 0.0–0.7)
Eosinophils Relative: 1.8 % (ref 0.0–5.0)
HCT: 42.7 % (ref 39.0–52.0)
Hemoglobin: 14.2 g/dL (ref 13.0–17.0)
Lymphocytes Relative: 48 % — ABNORMAL HIGH (ref 12.0–46.0)
Lymphs Abs: 2.4 10*3/uL (ref 0.7–4.0)
MCHC: 33.2 g/dL (ref 30.0–36.0)
MCV: 92.8 fl (ref 78.0–100.0)
Monocytes Absolute: 0.4 10*3/uL (ref 0.1–1.0)
Monocytes Relative: 8.4 % (ref 3.0–12.0)
Neutro Abs: 2 10*3/uL (ref 1.4–7.7)
Neutrophils Relative %: 40.8 % — ABNORMAL LOW (ref 43.0–77.0)
Platelets: 195 10*3/uL (ref 150.0–400.0)
RBC: 4.6 Mil/uL (ref 4.22–5.81)
RDW: 13.3 % (ref 11.5–15.5)
WBC: 4.9 10*3/uL (ref 4.0–10.5)

## 2023-04-26 LAB — COMPREHENSIVE METABOLIC PANEL
ALT: 16 U/L (ref 0–53)
AST: 23 U/L (ref 0–37)
Albumin: 4.4 g/dL (ref 3.5–5.2)
Alkaline Phosphatase: 57 U/L (ref 39–117)
BUN: 15 mg/dL (ref 6–23)
CO2: 28 mEq/L (ref 19–32)
Calcium: 9.5 mg/dL (ref 8.4–10.5)
Chloride: 104 mEq/L (ref 96–112)
Creatinine, Ser: 0.87 mg/dL (ref 0.40–1.50)
GFR: 94.92 mL/min (ref >60.00)
Glucose, Bld: 91 mg/dL (ref 70–99)
Potassium: 4.2 mEq/L (ref 3.5–5.1)
Sodium: 139 mEq/L (ref 135–145)
Total Bilirubin: 0.7 mg/dL (ref 0.2–1.2)
Total Protein: 7.1 g/dL (ref 6.0–8.3)

## 2023-04-26 LAB — LIPID PANEL
Cholesterol: 181 mg/dL (ref 0–200)
HDL: 68.6 mg/dL (ref >39.00)
LDL Cholesterol: 88 mg/dL (ref 0–99)
NonHDL: 112.37
Total CHOL/HDL Ratio: 3
Triglycerides: 123 mg/dL (ref 0.0–149.0)
VLDL: 24.6 mg/dL (ref 0.0–40.0)

## 2023-04-26 LAB — HEMOGLOBIN A1C: Hgb A1c MFr Bld: 5.2 % (ref 4.6–6.5)

## 2023-04-26 NOTE — Progress Notes (Signed)
Manuel Price 57 y.o.   Chief Complaint  Patient presents with   Annual Exam    No concerns     HISTORY OF PRESENT ILLNESS: This is a 57 y.o. male here for annual exam. Overall doing well.  Has no complaints or medical concerns today.  HPI   Prior to Admission medications   Medication Sig Start Date End Date Taking? Authorizing Provider  fluticasone (FLONASE) 50 MCG/ACT nasal spray Place 1 spray into both nostrils in the morning and at bedtime. 02/16/22   Crain, Whitney L, PA  montelukast (SINGULAIR) 10 MG tablet Take 1 tablet (10 mg total) by mouth at bedtime. 02/16/22   Guy Sandifer L, PA    No Known Allergies  Patient Active Problem List   Diagnosis Date Noted   History of hypertension 10/10/2017   Obstructive sleep apnea 07/12/2013   Morbid obesity (HCC) 04/30/2013    Past Medical History:  Diagnosis Date   Allergy    SEASONAL   H/O blood clots 15 years +   RIGHT KNEE AFTER SURGERY NO CAUSE FOUND   Hypertension    Lower extremity edema    RIGHT LEG PROPS UP AND GOES DOWN OCC   Sleep apnea     Past Surgical History:  Procedure Laterality Date   COLONOSCOPY WITH PROPOFOL N/A 01/30/2018   Procedure: COLONOSCOPY WITH PROPOFOL;  Surgeon: Beverley Fiedler, MD;  Location: [facility];  Service: Gastroenterology;  Laterality: N/A;   KNEE ARTHROSCOPY Right 20 YRS AGO    Social History   Socioeconomic History   Marital status: Married    Spouse name: Not on file   Number of children: 3   Years of education: Not on file   Highest education level: Not on file  Occupational History   Occupation: Therapist, sports: [employer]  Tobacco Use   Smoking status: Former    Current packs/day: 0.00    Average packs/day: 0.2 packs/day for 3.0 years (0.6 ttl pk-yrs)    Types: Cigarettes    Start date: 07/13/1995    Quit date: 07/12/1998    Years since quitting: 24.8   Smokeless tobacco: Never  Vaping Use   Vaping status: Never Used  Substance and  Sexual Activity   Alcohol use: No    Comment: rarely   Drug use: No   Sexual activity: Not on file  Other Topics Concern   Not on file  Social History Narrative   Not on file   Social Determinants of Health   Financial Resource Strain: Not on file  Food Insecurity: Not on file  Transportation Needs: Not on file  Physical Activity: Not on file  Stress: Not on file  Social Connections: Not on file  Intimate Partner Violence: Not on file    Family History  Problem Relation Age of Onset   Stroke Mother    Emphysema Maternal Grandmother        non smoker   Colon cancer Neg Hx    Esophageal cancer Neg Hx    Liver cancer Neg Hx    Pancreatic cancer Neg Hx    Rectal cancer Neg Hx    Stomach cancer Neg Hx      Review of Systems  Constitutional: Negative.  Negative for chills and fever.  HENT: Negative.  Negative for congestion and sore throat.   Respiratory: Negative.  Negative for cough and shortness of breath.   Cardiovascular: Negative.  Negative for chest pain and palpitations.  Gastrointestinal:  Negative  for abdominal pain, diarrhea, nausea and vomiting.  Genitourinary: Negative.  Negative for dysuria and hematuria.  Skin: Negative.  Negative for rash.  Neurological: Negative.  Negative for dizziness and headaches.  All other systems reviewed and are negative.   Today's Vitals   04/26/23 1333  BP: 128/76  Pulse: 60  Temp: 98.4 F (36.9 C)  TempSrc: Oral  SpO2: 97%  Weight: 258 lb 4 oz (117.1 kg)  Height: 6' (1.829 m)   Body mass index is 35.02 kg/m. Wt Readings from Last 3 Encounters:  04/26/23 258 lb 4 oz (117.1 kg)  12/01/21 243 lb (110.2 kg)  11/19/19 249 lb (112.9 kg)      Physical Exam Vitals reviewed.  Constitutional:      Appearance: Normal appearance. He is obese.  HENT:     Head: Normocephalic.     Right Ear: Tympanic membrane, ear canal and external ear normal.     Left Ear: Tympanic membrane, ear canal and external ear normal.      Mouth/Throat:     Mouth: Mucous membranes are moist.     Pharynx: Oropharynx is clear.  Eyes:     Extraocular Movements: Extraocular movements intact.     Pupils: Pupils are equal, round, and reactive to light.  Cardiovascular:     Rate and Rhythm: Normal rate and regular rhythm.     Pulses: Normal pulses.     Heart sounds: Normal heart sounds.  Pulmonary:     Effort: Pulmonary effort is normal.     Breath sounds: Normal breath sounds.  Abdominal:     Palpations: Abdomen is soft.     Tenderness: There is no abdominal tenderness.  Musculoskeletal:     Cervical back: No tenderness.  Lymphadenopathy:     Cervical: No cervical adenopathy.  Skin:    General: Skin is warm and dry.     Capillary Refill: Capillary refill takes less than 2 seconds.  Neurological:     General: No focal deficit present.     Mental Status: He is alert and oriented to person, place, and time.  Psychiatric:        Mood and Affect: Mood normal.        Behavior: Behavior normal.      ASSESSMENT & PLAN: Problem List Items Addressed This Visit       Other   Morbid obesity (HCC)   Other Visit Diagnoses     Routine general medical examination at a health care facility    -  Primary   Relevant Orders   CBC with Differential   Comprehensive metabolic panel   Hemoglobin A1c   Lipid panel   Hepatitis C antibody screen   HIV antibody   Encounter for screening for HIV       Relevant Orders   HIV antibody   Need for hepatitis C screening test       Relevant Orders   Hepatitis C antibody screen   Screening for deficiency anemia       Relevant Orders   CBC with Differential   Screening for lipoid disorders       Relevant Orders   Lipid panel   Screening for endocrine, metabolic and immunity disorder       Relevant Orders   Comprehensive metabolic panel   Hemoglobin A1c      Modifiable risk factors discussed with patient. Anticipatory guidance according to age provided. The following topics  were also discussed: Social Determinants of Health Smoking.  Non-smoker  Diet and nutrition.  Advised to decrease amount of daily carbohydrate intake and daily calories and increase amount of plant-based protein in his diet Benefits of exercise Cancer screening and review of colonoscopy report from 2019 Vaccinations review and recommendations Cardiovascular risk assessment and need for blood work The 10-year ASCVD risk score (Arnett DK, et al., 2019) is: 4.5%   Values used to calculate the score:     Age: 59 years     Sex: Male     Is Non-Hispanic African American: No     Diabetic: No     Tobacco smoker: No     Systolic Blood Pressure: 128 mmHg     Is BP treated: No     HDL Cholesterol: 72.2 mg/dL     Total Cholesterol: 165 mg/dL  Mental health including depression and anxiety Fall and accident prevention  Patient Instructions  Health Maintenance, Male Adopting a healthy lifestyle and getting preventive care are important in promoting health and wellness. Ask your health care provider about: The right schedule for you to have regular tests and exams. Things you can do on your own to prevent diseases and keep yourself healthy. What should I know about diet, weight, and exercise? Eat a healthy diet  Eat a diet that includes plenty of vegetables, fruits, low-fat dairy products, and lean protein. Do not eat a lot of foods that are high in solid fats, added sugars, or sodium. Maintain a healthy weight Body mass index (BMI) is a measurement that can be used to identify possible weight problems. It estimates body fat based on height and weight. Your health care provider can help determine your BMI and help you achieve or maintain a healthy weight. Get regular exercise Get regular exercise. This is one of the most important things you can do for your health. Most adults should: Exercise for at least 150 minutes each week. The exercise should increase your heart rate and make you sweat  (moderate-intensity exercise). Do strengthening exercises at least twice a week. This is in addition to the moderate-intensity exercise. Spend less time sitting. Even light physical activity can be beneficial. Watch cholesterol and blood lipids Have your blood tested for lipids and cholesterol at 59 years of age, then have this test every 5 years. You may need to have your cholesterol levels checked more often if: Your lipid or cholesterol levels are high. You are older than 59 years of age. You are at high risk for heart disease. What should I know about cancer screening? Many types of cancers can be detected early and may often be prevented. Depending on your health history and family history, you may need to have cancer screening at various ages. This may include screening for: Colorectal cancer. Prostate cancer. Skin cancer. Lung cancer. What should I know about heart disease, diabetes, and high blood pressure? Blood pressure and heart disease High blood pressure causes heart disease and increases the risk of stroke. This is more likely to develop in people who have high blood pressure readings or are overweight. Talk with your health care provider about your target blood pressure readings. Have your blood pressure checked: Every 3-5 years if you are 47-45 years of age. Every year if you are 106 years old or older. If you are between the ages of 53 and 65 and are a current or former smoker, ask your health care provider if you should have a one-time screening for abdominal aortic aneurysm (AAA). Diabetes Have regular diabetes screenings. This checks  your fasting blood sugar level. Have the screening done: Once every three years after age 11 if you are at a normal weight and have a low risk for diabetes. More often and at a younger age if you are overweight or have a high risk for diabetes. What should I know about preventing infection? Hepatitis B If you have a higher risk for  hepatitis B, you should be screened for this virus. Talk with your health care provider to find out if you are at risk for hepatitis B infection. Hepatitis C Blood testing is recommended for: Everyone born from [birth year] through 1967. Anyone with known risk factors for hepatitis C. Sexually transmitted infections (STIs) You should be screened each year for STIs, including gonorrhea and chlamydia, if: You are sexually active and are younger than 59 years of age. You are older than 59 years of age and your health care provider tells you that you are at risk for this type of infection. Your sexual activity has changed since you were last screened, and you are at increased risk for chlamydia or gonorrhea. Ask your health care provider if you are at risk. Ask your health care provider about whether you are at high risk for HIV. Your health care provider may recommend a prescription medicine to help prevent HIV infection. If you choose to take medicine to prevent HIV, you should first get tested for HIV. You should then be tested every 3 months for as long as you are taking the medicine. Follow these instructions at home: Alcohol use Do not drink alcohol if your health care provider tells you not to drink. If you drink alcohol: Limit how much you have to 0-2 drinks a day. Know how much alcohol is in your drink. In the [place] one drink equals one 12 oz bottle of beer (355 mL), one 5 oz glass of wine (148 mL), or one 1 oz glass of hard liquor (44 mL). Lifestyle Do not use any products that contain nicotine or tobacco. These products include cigarettes, chewing tobacco, and vaping devices, such as e-cigarettes. If you need help quitting, ask your health care provider. Do not use street drugs. Do not share needles. Ask your health care provider for help if you need support or information about quitting drugs. General instructions Schedule regular health, dental, and eye exams. Stay current with your  vaccines. Tell your health care provider if: You often feel depressed. You have ever been abused or do not feel safe at home. Summary Adopting a healthy lifestyle and getting preventive care are important in promoting health and wellness. Follow your health care provider's instructions about healthy diet, exercising, and getting tested or screened for diseases. Follow your health care provider's instructions on monitoring your cholesterol and blood pressure. This information is not intended to replace advice given to you by your health care provider. Make sure you discuss any questions you have with your health care provider. Document Revised: 02/08/2021 Document Reviewed: 02/08/2021 Elsevier Patient Education  2024 Elsevier Inc.      Edwina Barth, MD [facility]

## 2023-04-26 NOTE — Patient Instructions (Signed)
Health Maintenance, Male Adopting a healthy lifestyle and getting preventive care are important in promoting health and wellness. Ask your health care provider about: The right schedule for you to have regular tests and exams. Things you can do on your own to prevent diseases and keep yourself healthy. What should I know about diet, weight, and exercise? Eat a healthy diet  Eat a diet that includes plenty of vegetables, fruits, low-fat dairy products, and lean protein. Do not eat a lot of foods that are high in solid fats, added sugars, or sodium. Maintain a healthy weight Body mass index (BMI) is a measurement that can be used to identify possible weight problems. It estimates body fat based on height and weight. Your health care provider can help determine your BMI and help you achieve or maintain a healthy weight. Get regular exercise Get regular exercise. This is one of the most important things you can do for your health. Most adults should: Exercise for at least 150 minutes each week. The exercise should increase your heart rate and make you sweat (moderate-intensity exercise). Do strengthening exercises at least twice a week. This is in addition to the moderate-intensity exercise. Spend less time sitting. Even light physical activity can be beneficial. Watch cholesterol and blood lipids Have your blood tested for lipids and cholesterol at 59 years of age, then have this test every 5 years. You may need to have your cholesterol levels checked more often if: Your lipid or cholesterol levels are high. You are older than 59 years of age. You are at high risk for heart disease. What should I know about cancer screening? Many types of cancers can be detected early and may often be prevented. Depending on your health history and family history, you may need to have cancer screening at various ages. This may include screening for: Colorectal cancer. Prostate cancer. Skin cancer. Lung  cancer. What should I know about heart disease, diabetes, and high blood pressure? Blood pressure and heart disease High blood pressure causes heart disease and increases the risk of stroke. This is more likely to develop in people who have high blood pressure readings or are overweight. Talk with your health care provider about your target blood pressure readings. Have your blood pressure checked: Every 3-5 years if you are 18-39 years of age. Every year if you are 40 years old or older. If you are between the ages of 65 and 75 and are a current or former smoker, ask your health care provider if you should have a one-time screening for abdominal aortic aneurysm (AAA). Diabetes Have regular diabetes screenings. This checks your fasting blood sugar level. Have the screening done: Once every three years after age 45 if you are at a normal weight and have a low risk for diabetes. More often and at a younger age if you are overweight or have a high risk for diabetes. What should I know about preventing infection? Hepatitis B If you have a higher risk for hepatitis B, you should be screened for this virus. Talk with your health care provider to find out if you are at risk for hepatitis B infection. Hepatitis C Blood testing is recommended for: Everyone born from 1945 through 1965. Anyone with known risk factors for hepatitis C. Sexually transmitted infections (STIs) You should be screened each year for STIs, including gonorrhea and chlamydia, if: You are sexually active and are younger than 59 years of age. You are older than 59 years of age and your   health care provider tells you that you are at risk for this type of infection. Your sexual activity has changed since you were last screened, and you are at increased risk for chlamydia or gonorrhea. Ask your health care provider if you are at risk. Ask your health care provider about whether you are at high risk for HIV. Your health care provider  may recommend a prescription medicine to help prevent HIV infection. If you choose to take medicine to prevent HIV, you should first get tested for HIV. You should then be tested every 3 months for as long as you are taking the medicine. Follow these instructions at home: Alcohol use Do not drink alcohol if your health care provider tells you not to drink. If you drink alcohol: Limit how much you have to 0-2 drinks a day. Know how much alcohol is in your drink. In the [place] one drink equals one 12 oz bottle of beer (355 mL), one 5 oz glass of wine (148 mL), or one 1 oz glass of hard liquor (44 mL). Lifestyle Do not use any products that contain nicotine or tobacco. These products include cigarettes, chewing tobacco, and vaping devices, such as e-cigarettes. If you need help quitting, ask your health care provider. Do not use street drugs. Do not share needles. Ask your health care provider for help if you need support or information about quitting drugs. General instructions Schedule regular health, dental, and eye exams. Stay current with your vaccines. Tell your health care provider if: You often feel depressed. You have ever been abused or do not feel safe at home. Summary Adopting a healthy lifestyle and getting preventive care are important in promoting health and wellness. Follow your health care provider's instructions about healthy diet, exercising, and getting tested or screened for diseases. Follow your health care provider's instructions on monitoring your cholesterol and blood pressure. This information is not intended to replace advice given to you by your health care provider. Make sure you discuss any questions you have with your health care provider. Document Revised: 02/08/2021 Document Reviewed: 02/08/2021 Elsevier Patient Education  2024 Elsevier Inc.  

## 2023-04-27 ENCOUNTER — Encounter: Payer: Self-pay | Admitting: Emergency Medicine

## 2023-04-27 LAB — HIV ANTIBODY (ROUTINE TESTING W REFLEX): HIV 1&2 Ab, 4th Generation: NONREACTIVE

## 2023-06-29 ENCOUNTER — Ambulatory Visit: Payer: Self-pay

## 2024-01-22 IMAGING — DX DG CHEST 2V
2 series · 2 of 2 positions shown · non-contrast
Comparison: 11/24/2021

CLINICAL DATA: Productive cough for 3 weeks.  History of smoking.

EXAM:
CHEST - 2 VIEW

[chest pa]
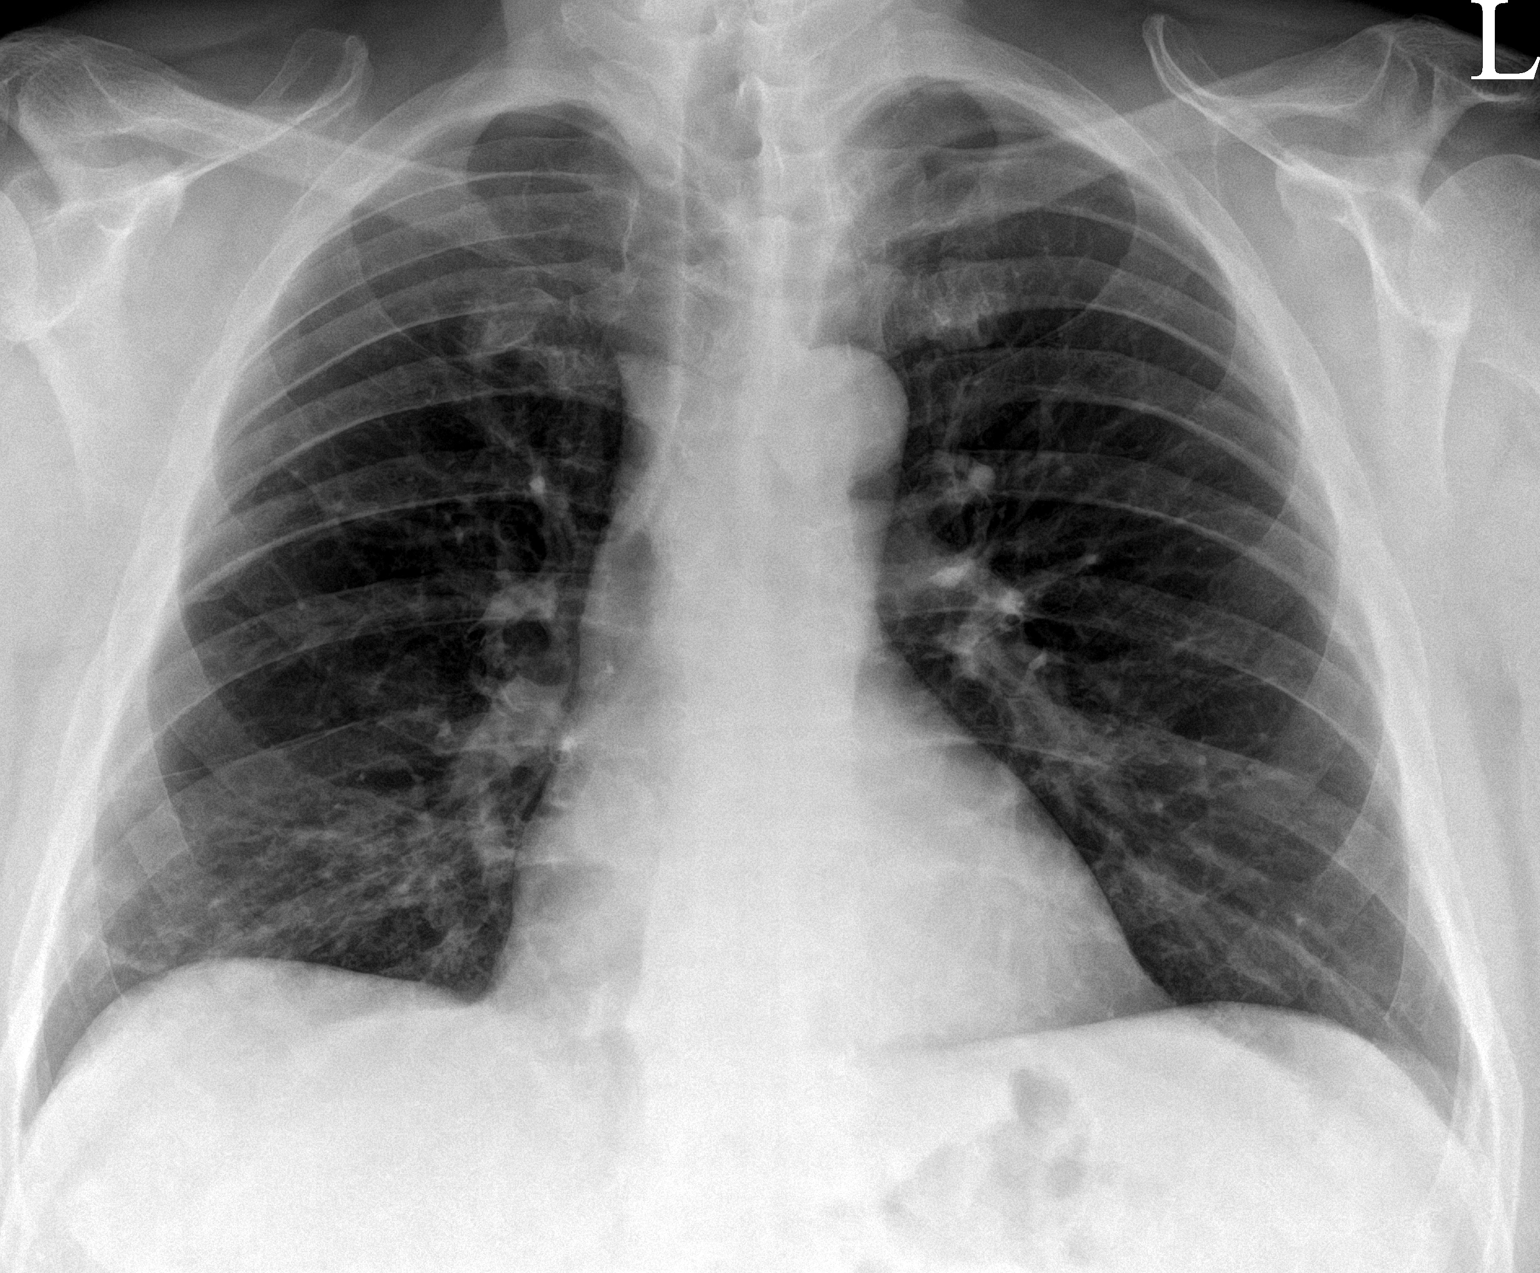

[chest lat]
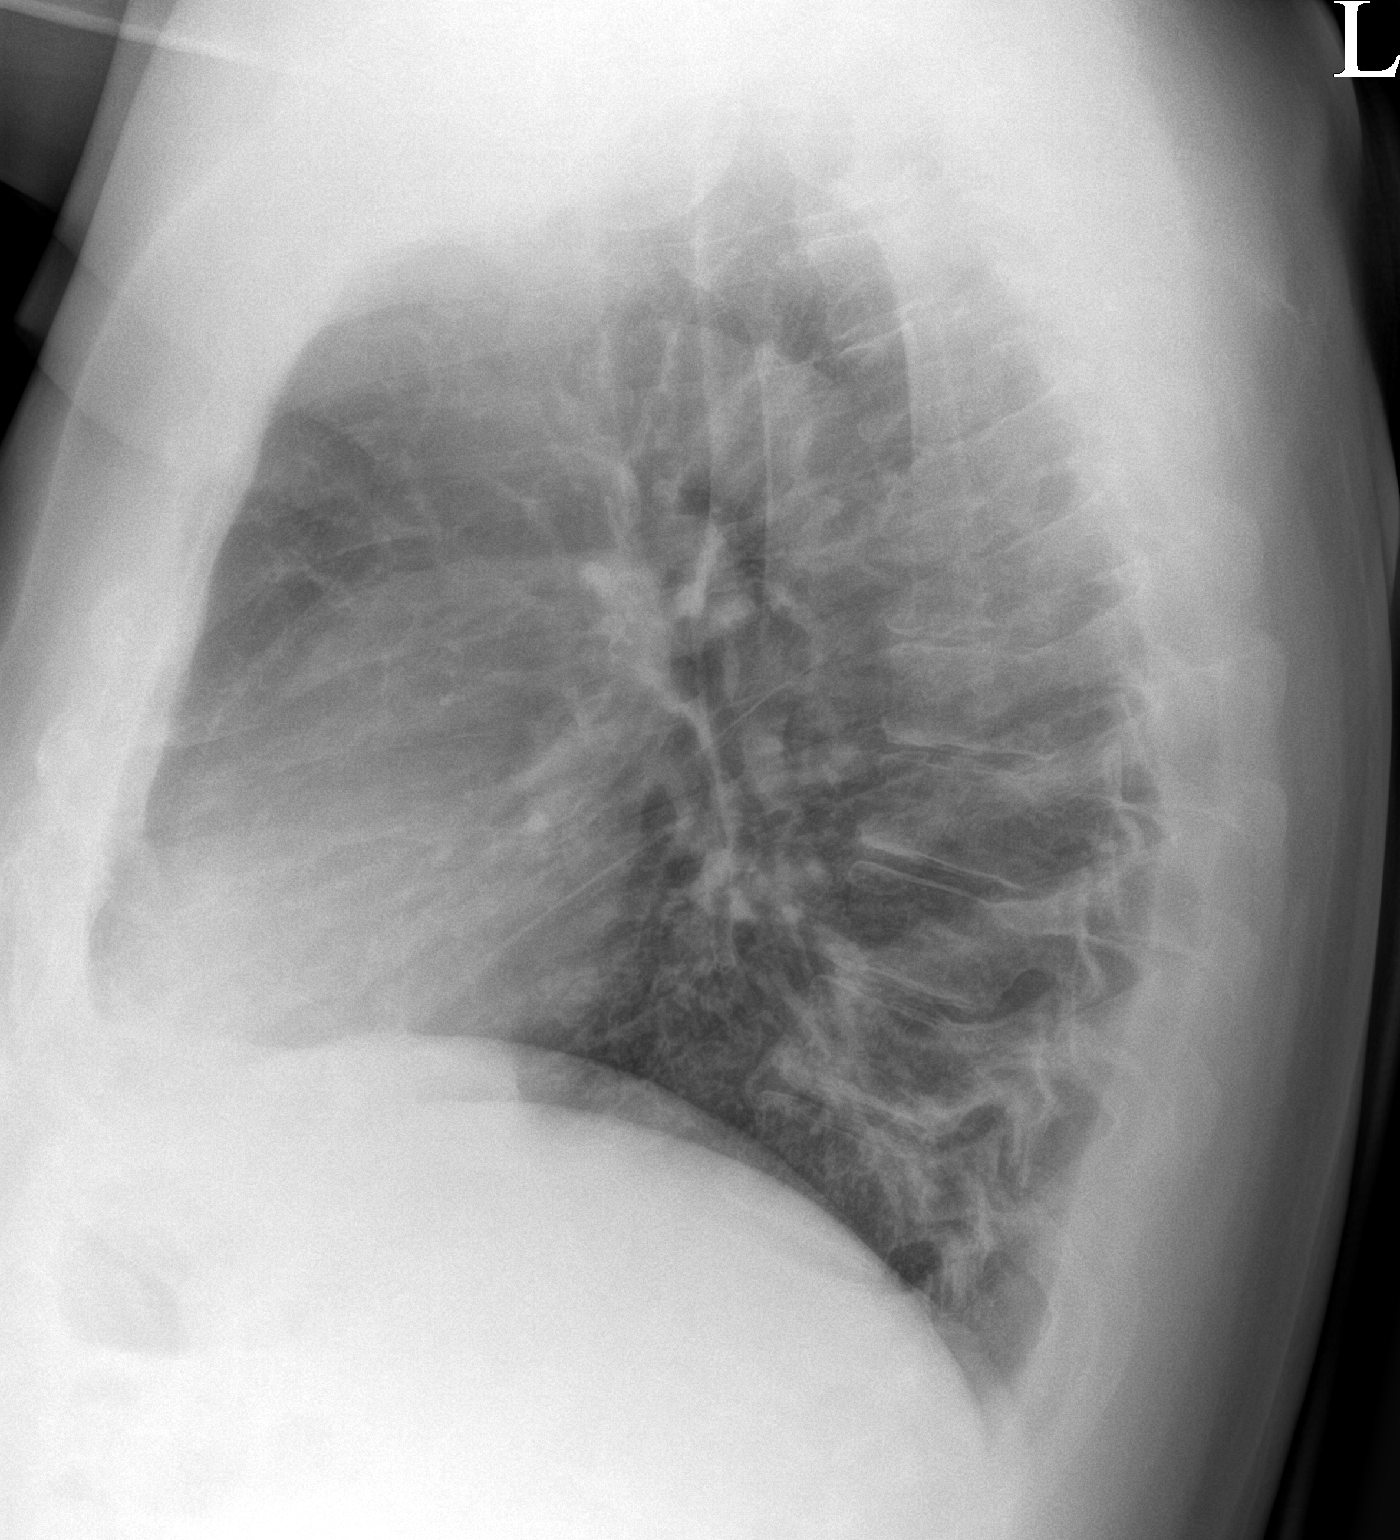

[2 of 2 positions shown; findings below may reference images not displayed]

FINDINGS: The cardiomediastinal silhouette is unchanged with normal heart
size. The interstitial markings remain increased in the right lung
base, similar to the prior study and greater than a baseline
examination from [id number]. Minimal scarring is noted more laterally in
the right lung base. The lungs are otherwise clear. No pleural
effusion or pneumothorax is identified. No acute osseous abnormality
is seen.
IMPRESSION: Persistently increased markings in the right lung base which could
reflect atelectasis versus infection.

## 2024-07-01 ENCOUNTER — Emergency Department (HOSPITAL_BASED_OUTPATIENT_CLINIC_OR_DEPARTMENT_OTHER)
Admission: EM | Admit: 2024-07-01 | Discharge: 2024-07-01 | Disposition: A | Discharge status: Home / Self Care | Payer: Self-pay

## 2024-07-01 ENCOUNTER — Encounter (HOSPITAL_BASED_OUTPATIENT_CLINIC_OR_DEPARTMENT_OTHER): Payer: Self-pay

## 2024-07-01 ENCOUNTER — Emergency Department (HOSPITAL_BASED_OUTPATIENT_CLINIC_OR_DEPARTMENT_OTHER): Payer: Self-pay

## 2024-07-01 DIAGNOSIS — I10 Essential (primary) hypertension: Secondary | ICD-10-CM | POA: Insufficient documentation

## 2024-07-01 DIAGNOSIS — R221 Localized swelling, mass and lump, neck: Secondary | ICD-10-CM | POA: Insufficient documentation

## 2024-07-01 LAB — CBC
HCT: 41.7 % (ref 39.0–52.0)
Hemoglobin: 14.5 g/dL (ref 13.0–17.0)
MCH: 31.5 pg (ref 26.0–34.0)
MCHC: 34.8 g/dL (ref 30.0–36.0)
MCV: 90.7 fL (ref 80.0–100.0)
Platelets: 219 K/uL (ref 150–400)
RBC: 4.6 MIL/uL (ref 4.22–5.81)
RDW: 12.6 % (ref 11.5–15.5)
WBC: 6 K/uL (ref 4.0–10.5)
nRBC: 0 % (ref 0.0–0.2)

## 2024-07-01 LAB — BASIC METABOLIC PANEL WITH GFR
Anion gap: 12 (ref 5–15)
BUN: 16 mg/dL (ref 6–20)
CO2: 23 mmol/L (ref 22–32)
Calcium: 9.7 mg/dL (ref 8.9–10.3)
Chloride: 105 mmol/L (ref 98–111)
Creatinine, Ser: 0.82 mg/dL (ref 0.61–1.24)
GFR, Estimated: >60 mL/min (ref >60)
Glucose, Bld: 100 mg/dL — ABNORMAL HIGH (ref 70–99)
Potassium: 4.2 mmol/L (ref 3.5–5.1)
Sodium: 140 mmol/L (ref 135–145)

## 2024-07-01 MED ORDER — IOHEXOL 300 MG/ML  SOLN
100.0000 mL | Freq: Once | INTRAMUSCULAR | Status: AC | PRN
  Administered 2024-07-01: 75 mL via INTRAVENOUS

## 2024-07-01 NOTE — ED Notes (Signed)
 RN reviewed discharge instructions with pt. Pt verbalized understanding and had no further questions

## 2024-07-01 NOTE — ED Provider Triage Note (Signed)
 Emergency Medicine Provider Triage Evaluation Note  Clovis Mankins , a 58 y.o. male  was evaluated in triage. Pt complains of neck mass. Has been there for a few years but per his daughter is much bigger today. Located in left lateral neck. Not painful. Requesting a ct scan.   Review of Systems  Positive: See above Negative: See above  Physical Exam  BP 128/82 (BP Location: Right Arm)   Pulse 65   Temp 98.2 F (36.8 C)   Resp 17   Ht 5' 11 (1.803 m)   Wt 117.9 kg   SpO2 96%   BMI 36.26 kg/m  Gen:   Awake, no distress   Resp:  Normal effort  MSK:   Moves extremities without difficulty  Other:  See above  Medical Decision Making  Medically screening exam initiated at 12:45 PM.  Appropriate orders placed.  Audiel Scheiber was informed that the remainder of the evaluation will be completed by another provider, this initial triage assessment does not replace that evaluation, and the importance of remaining in the ED until their evaluation is complete.  Work up started   Lang Norleen POUR, PA-C 07/01/24 1247

## 2024-07-01 NOTE — ED Provider Notes (Signed)
 [place] EMERGENCY DEPARTMENT AT [facility] Provider Note   CSN: [id number] Arrival date & time: 07/01/24  1136     Patient presents with: Mass (On neck)   Jamauri Kruzel is a 58 y.o. male.   This is a 58 year old male presenting emergency department with a lump on his left lateral neck.  Reports it has been there for several years.  Patient's family thought it looked bigger the past few days and thought it should get checked out.  Per patient's report he states it feels and looks the same to him.  He is not having any pain or discomfort.  No dysphagia difficulty swallowing.  Denies constitutional symptoms.        Prior to Admission medications   Medication Sig Start Date End Date Taking? Authorizing Provider  fluticasone  (FLONASE ) 50 MCG/ACT nasal spray Place 1 spray into both nostrils in the morning and at bedtime. Patient not taking: Reported on 04/26/2023 02/16/22   Crain, Whitney L, PA  montelukast  (SINGULAIR ) 10 MG tablet Take 1 tablet (10 mg total) by mouth at bedtime. Patient not taking: Reported on 04/26/2023 02/16/22   Crain, Whitney L, PA    Allergies: Patient has no known allergies.    Review of Systems  Updated Vital Signs BP 128/82 (BP Location: Right Arm)   Pulse 65   Temp 98.2 F (36.8 C)   Resp 17   Ht 5' 11 (1.803 m)   Wt 117.9 kg   SpO2 96%   BMI 36.26 kg/m   Physical Exam Vitals and nursing note reviewed.  Constitutional:      General: He is not in acute distress.    Appearance: He is not toxic-appearing.  HENT:     Nose: Nose normal.     Mouth/Throat:     Mouth: Mucous membranes are dry.  Eyes:     Conjunctiva/sclera: Conjunctivae normal.  Neck:     Comments: Small marble sized mass on the left lateral posterior neck posterior to the sternocleidomastoid.  Nontender.  No overlying erythema induration or fluctuance. Cardiovascular:     Rate and Rhythm: Normal rate.  Pulmonary:     Effort: Pulmonary effort is normal.   Abdominal:     General: Abdomen is flat.     Palpations: Abdomen is soft.  Musculoskeletal:        General: Normal range of motion.  Skin:    General: Skin is warm.     Capillary Refill: Capillary refill takes less than 2 seconds.  Neurological:     Mental Status: He is alert and oriented to person, place, and time.  Psychiatric:        Mood and Affect: Mood normal.        Behavior: Behavior normal.     (all labs ordered are listed, but only abnormal results are displayed) Labs Reviewed  BASIC METABOLIC PANEL WITH GFR - Abnormal; Notable for the following components:      Result Value   Glucose, Bld 100 (*)    All other components within normal limits  CBC    EKG: None  Radiology: CT Soft Tissue Neck W Contrast Result Date: 07/01/2024 EXAM: CT NECK WITH CONTRAST 07/01/2024 01:53:17 PM TECHNIQUE: CT of the neck was performed with the administration of 75 mL of iohexol  (OMNIPAQUE ) 300 MG/ML solution. Multiplanar reformatted images are provided for review. Automated exposure control, iterative reconstruction, and/or weight based adjustment of the mA/kV was utilized to reduce the radiation dose to as low as reasonably achievable.  COMPARISON: None available. CLINICAL HISTORY: neck mass. Lump to left side neck for years. Worse in last two days. FINDINGS: AERODIGESTIVE TRACT: No discrete mass. No edema. SALIVARY GLANDS: The parotid and submandibular glands are unremarkable. THYROID: Unremarkable. LYMPH NODES: No suspicious cervical lymphadenopathy. SOFT TISSUES: There is a 2.1 x 2.3 x 1.5 cm irregular mass involving the subcutaneous tissues of the left posterolateral neck along the posterior margin of the sternocleidomastoid muscle near the level of C2-C3. This lesion appears to demonstrate areas of fat attenuation along the anteromedial margin with more lobulated soft tissue laterally. There is no significant surrounding inflammatory change. There is no involvement of the underlying  musculature. There are no areas of masslike soft tissue involving deep spaces of the neck. BRAIN, ORBITS, SINUSES AND MASTOIDS: No acute abnormality. LUNGS AND MEDIASTINUM: No acute abnormality. BONES: No focal bone abnormality. IMPRESSION: 1. 2.1 x 2.3 x 1.5 cm irregular mass in the left posterolateral neck subcutaneous tissues along the posterior margin of the sternocleidomastoid with areas of fat attenuation anteromedially and lobulated soft tissue laterally. Finding could reflect and enlarged lymph node versus mesenchymal neoplasm. Recommend biopsy for further evaluation. 2. No involvement of deep spaces of the neck. Electronically signed by: Donnice Mania MD 07/01/2024 03:21 PM EDT RP Workstation: [id number]     Procedures   Medications Ordered in the ED  iohexol  (OMNIPAQUE ) 300 MG/ML solution 100 mL (75 mLs Intravenous Contrast Given 07/01/24 1345)    Clinical Course as of 07/01/24 1541  Mon Jul 01, 2024  1522 CT Soft Tissue Neck W Contrast IMPRESSION: 1. 2.1 x 2.3 x 1.5 cm irregular mass in the left posterolateral neck subcutaneous tissues along the posterior margin of the sternocleidomastoid with areas of fat attenuation anteromedially and lobulated soft tissue laterally. Finding could reflect and enlarged lymph node versus mesenchymal neoplasm. Recommend biopsy for further evaluation. 2. No involvement of deep spaces of the neck [TY]  1523 CBC No WBC to suggest systemic infection. No anemia.  [TY]  1524 Basic metabolic panel(!) No metabolic derangements. Normal kidney function.  [TY]    Clinical Course User Index [TY] Neysa Caron PARAS, DO                                 Medical Decision Making This is a 58 year old male presenting to the emergency department for a mass on his left lateral neck.  There is a past medical history significant for hypertension and obesity.  Physical exam with a nontender small marble sized mass.  Does not appear to be infected as he has no overlying  erythema induration fluctuance.  It is nontender to palpation.  Labs ordered in triage as noted in ED course reassuring.  CT scan with a 2 x 2 cm mass with enlarged lymph nodes versus mesenchymal neoplasm.  Discussed findings with patient and need for further workup.  Given reassuring vital signs, physical exam labs and imaging feel that this can be deferred to the outpatient setting.  Will refer him to oncology and also discussed follow-up with his primary doctor for further testing/biopsy.  Patient voiced understanding.  Was given return precautions.  He was discharged in stable condition.  Amount and/or Complexity of Data Reviewed Independent Historian:     Details: Family members notes that it does appear larger over the past several days External Data Reviewed:     Details: No prior imaging Labs:  Decision-making details documented in ED Course.  Radiology:  Decision-making details documented in ED Course.    Details: Do not appreciate free air in soft tissue  Risk Decision regarding hospitalization. Diagnosis or treatment significantly limited by social determinants of health.        Final diagnoses:  None    ED Discharge Orders     None          Neysa Caron PARAS, DO 07/01/24 1541

## 2024-07-01 NOTE — Discharge Instructions (Addendum)
 Please follow-up with your primary doctor.  We have to the hematology/oncology.  They should call you to schedule an appointment.  If they do not call, you may call to schedule an appointment.  Return if no fevers, chills, severe pain, difficulty swallowing, difficulty breathing, severe headache or any new or worsening symptoms that are concerning to you.

## 2024-07-01 NOTE — ED Triage Notes (Signed)
 Lump to left side neck for years.  Worse in last two days

## 2024-07-02 ENCOUNTER — Encounter: Payer: Self-pay | Admitting: Emergency Medicine

## 2024-07-02 ENCOUNTER — Ambulatory Visit (INDEPENDENT_AMBULATORY_CARE_PROVIDER_SITE_OTHER): Payer: Self-pay | Admitting: Emergency Medicine

## 2024-07-02 ENCOUNTER — Encounter: Payer: Self-pay | Admitting: Medical Oncology

## 2024-07-02 VITALS — BP 120/84 | HR 69 | Temp 98.1°F | Ht 71.0 in | Wt 262.0 lb

## 2024-07-02 DIAGNOSIS — L723 Sebaceous cyst: Secondary | ICD-10-CM

## 2024-07-02 NOTE — Patient Instructions (Signed)
 Bolsa de lquido en la piel (quiste epidermoide): Qu debe saber Pocket of Fluid in the Skin (Epidermoid Cyst): What to Know  Un quiste epidermoide es un bulto pequeo que se encuentra debajo de la piel. El quiste contiene una sustancia espesa y oleosa. Cules son las causas? Un folculo piloso obstruido. Un pelo que se riza y vuelve a entrar en la piel en vez de crecer hacia fuera de la piel. Un poro obstruido. Irritacin de la piel. Una lesin en la piel. Ciertas afecciones que se transmiten de padres a hijos. Virus del papiloma humano (VPH). Esto ocurre rara vez cuando se producen quistes en la parte inferior de los pies. Dao a largo plazo en la piel causado por el sol. Qu incrementa el riesgo? Acn. Ser hombre. Sufrir una [employer]. Haber pasado la pubertad. Ciertas afecciones que se transmiten en la familia (trastorno gentico). Cules son los signos o sntomas? El nico sntoma de este tipo de quiste puede ser un bulto pequeo e indoloro debajo de la piel. En general, estos quistes no causan dolor, pero pueden infectarse. Los sntomas de infeccin pueden incluir los siguientes: Enrojecimiento. Inflamacin. Dolor a la palpacin. Calor. Lajune. Del quiste sale una sustancia con mal olor. Del [employer] pus. Cmo se trata? Si el quiste se inflama, el tratamiento puede incluir: Gypsy y Electronics engineer. Antibiticos. Inyecciones de medicamentos llamados corticoesteroides que ayudan a Social research officer, government. Ciruga para extirpar el quiste si es grande, doloroso o podra Production designer, theatre/television/film. No intente abrir ni apretar el quiste usted mismo. Siga estas indicaciones en su casa: Medicamentos Use los medicamentos nicamente segn las indicaciones. Si le han recetado antibiticos, tmelos como se lo indiquen. No deje de tomarlos aunque comience a sentirse mejor. Indicaciones generales Mantenga limpia y seca el rea que rodea al [place]. Use ropa holgada y  seca. Evite tocarse el quiste. Revsese el [employer] para detectar signos de infeccin. Est atento a los siguientes signos: Enrojecimiento, Optician, dispensing. Lquido o sangre. Calor. Pus o mal olor. Concurra a todas las visitas de seguimiento para asegurarse de no sentir incomodidad ni tener una infeccin. Comunquese con un mdico si: Tiene signos de infeccin. El quiste no mejora o [place]. Tiene un quiste que se ve diferente a otros quistes que ha tenido. Tiene fiebre. Tiene enrojecimiento que se extiende desde el quiste. Esta informacin no tiene Theme park manager el consejo del mdico. Asegrese de hacerle al mdico cualquier pregunta que tenga. Document Revised: 05/29/2023 Document Reviewed: 05/29/2023 Elsevier Patient Education  2024 [employer].

## 2024-07-02 NOTE — Progress Notes (Signed)
 Manuel Price 58 y.o.   Chief Complaint  Patient presents with   Cyst    Patient states he has a knot on the left side of his neck that has been there for 2 years. Pt states it does not hurt, it is some what hard. Patient also had a slip some finger and rib pain.  Patients daughter mentions wanting his gallstones looked at it since it been a couple of years     HISTORY OF PRESENT ILLNESS: This is a 58 y.o. male complaining of painless mass to left side of his neck that has been present for the last 2 years at least History of bypass gastric surgery History of gallstones.  Asymptomatic No other complaints or medical concerns today.  HPI   Prior to Admission medications   Medication Sig Start Date End Date Taking? Authorizing Provider  fluticasone  (FLONASE ) 50 MCG/ACT nasal spray Place 1 spray into both nostrils in the morning and at bedtime. Patient not taking: Reported on 07/02/2024 02/16/22   Crain, Whitney L, PA  montelukast  (SINGULAIR ) 10 MG tablet Take 1 tablet (10 mg total) by mouth at bedtime. Patient not taking: Reported on 07/02/2024 02/16/22   Lowella Benton CROME, [place]    No Known Allergies  Patient Active Problem List   Diagnosis Date Noted   Sebaceous cyst 07/02/2024   History of hypertension 10/10/2017   Obstructive sleep apnea 07/12/2013   Morbid obesity (HCC) 04/30/2013    Past Medical History:  Diagnosis Date   Allergy    SEASONAL   H/O blood clots 15 years +   RIGHT KNEE AFTER SURGERY NO CAUSE FOUND   Hypertension    Lower extremity edema    RIGHT LEG PROPS UP AND GOES DOWN OCC   Sleep apnea     Past Surgical History:  Procedure Laterality Date   COLONOSCOPY WITH PROPOFOL  N/A 01/30/2018   Procedure: COLONOSCOPY WITH PROPOFOL ;  Surgeon: Albertus Gordy HERO, MD;  Location: [facility];  Service: Gastroenterology;  Laterality: N/A;   KNEE ARTHROSCOPY Right 20 YRS AGO    Social History   Socioeconomic History   Marital status: Married    Spouse name: Not  on file   Number of children: 3   Years of education: Not on file   Highest education level: Not on file  Occupational History   Occupation: Therapist, sports: [employer]  Tobacco Use   Smoking status: Former    Current packs/day: 0.00    Average packs/day: 0.2 packs/day for 3.0 years (0.6 ttl pk-yrs)    Types: Cigarettes    Start date: 07/13/1995    Quit date: 07/12/1998    Years since quitting: 25.9   Smokeless tobacco: Never  Vaping Use   Vaping status: Never Used  Substance and Sexual Activity   Alcohol use: No    Comment: rarely   Drug use: No   Sexual activity: Not on file  Other Topics Concern   Not on file  Social History Narrative   Not on file   Social Drivers of Health   Financial Resource Strain: Not on file  Food Insecurity: Not on file  Transportation Needs: Not on file  Physical Activity: Not on file  Stress: Not on file  Social Connections: Not on file  Intimate Partner Violence: Not on file    Family History  Problem Relation Age of Onset   Stroke Mother    Emphysema Maternal Grandmother        non smoker  Colon cancer Neg Hx    Esophageal cancer Neg Hx    Liver cancer Neg Hx    Pancreatic cancer Neg Hx    Rectal cancer Neg Hx    Stomach cancer Neg Hx      Review of Systems  Constitutional: Negative.  Negative for chills and fever.  HENT: Negative.  Negative for congestion and sore throat.   Respiratory: Negative.  Negative for cough and shortness of breath.   Cardiovascular: Negative.  Negative for chest pain and palpitations.  Gastrointestinal:  Negative for abdominal pain, nausea and vomiting.  Genitourinary: Negative.   Skin: Negative.  Negative for rash.  Neurological: Negative.  Negative for dizziness and headaches.  All other systems reviewed and are negative.   Vitals:   07/02/24 1452  BP: 120/84  Pulse: 69  Temp: 98.1 F (36.7 C)  SpO2: 95%    Physical Exam Vitals reviewed.  Constitutional:       Appearance: Normal appearance. He is obese.  HENT:     Head: Normocephalic.  Eyes:     Extraocular Movements: Extraocular movements intact.  Cardiovascular:     Rate and Rhythm: Normal rate.  Pulmonary:     Effort: Pulmonary effort is normal.  Skin:    General: Skin is warm and dry.     Comments: Nontender sebaceous cyst to left posterior neck  Neurological:     Mental Status: He is alert and oriented to person, place, and time.  Psychiatric:        Mood and Affect: Mood normal.        Behavior: Behavior normal.      ASSESSMENT & PLAN: Problem List Items Addressed This Visit       Musculoskeletal and Integument   Sebaceous cyst - Primary   On left mid posterior neck area Not suspicious for malignancy Nontender.  Movable.  No erythema No fluctuation.  No signs of infection Recommend soft tissue ultrasound      Relevant Orders   US  Soft Tissue Head/Neck (NON-THYROID)   Patient Instructions  Bolsa de lquido en la piel (quiste epidermoide): Qu debe saber Pocket of Fluid in the Skin (Epidermoid Cyst): What to Know  Un quiste epidermoide es un bulto pequeo que se encuentra debajo de la piel. El quiste contiene una sustancia espesa y oleosa. Cules son las causas? Un folculo piloso obstruido. Un pelo que se riza y vuelve a entrar en la piel en vez de crecer hacia fuera de la piel. Un poro obstruido. Irritacin de la piel. Una lesin en la piel. Ciertas afecciones que se transmiten de padres a hijos. Virus del papiloma humano (VPH). Esto ocurre rara vez cuando se producen quistes en la parte inferior de los pies. Dao a largo plazo en la piel causado por el sol. Qu incrementa el riesgo? Acn. Ser hombre. Sufrir una [employer]. Haber pasado la pubertad. Ciertas afecciones que se transmiten en la familia (trastorno gentico). Cules son los signos o sntomas? El nico sntoma de este tipo de quiste puede ser un bulto pequeo e indoloro debajo de la piel.  En general, estos quistes no causan dolor, pero pueden infectarse. Los sntomas de infeccin pueden incluir los siguientes: Enrojecimiento. Inflamacin. Dolor a la palpacin. Calor. Lajune. Del quiste sale una sustancia con mal olor. Del [employer] pus. Cmo se trata? Si el quiste se inflama, el tratamiento puede incluir: Gypsy y Electronics engineer. Antibiticos. Inyecciones de medicamentos llamados corticoesteroides que ayudan a Social research officer, government. Ciruga  para extirpar el quiste si es grande, doloroso o podra Production designer, theatre/television/film. No intente abrir ni apretar el quiste usted mismo. Siga estas indicaciones en su casa: Medicamentos Use los medicamentos nicamente segn las indicaciones. Si le han recetado antibiticos, tmelos como se lo indiquen. No deje de tomarlos aunque comience a sentirse mejor. Indicaciones generales Mantenga limpia y seca el rea que rodea al [place]. Use ropa holgada y seca. Evite tocarse el quiste. Revsese el [employer] para detectar signos de infeccin. Est atento a los siguientes signos: Enrojecimiento, Optician, dispensing. Lquido o sangre. Calor. Pus o mal olor. Concurra a todas las visitas de seguimiento para asegurarse de no sentir incomodidad ni tener una infeccin. Comunquese con un mdico si: Tiene signos de infeccin. El quiste no mejora o [place]. Tiene un quiste que se ve diferente a otros quistes que ha tenido. Tiene fiebre. Tiene enrojecimiento que se extiende desde el quiste. Esta informacin no tiene Theme park manager el consejo del mdico. Asegrese de hacerle al mdico cualquier pregunta que tenga. Document Revised: 05/29/2023 Document Reviewed: 05/29/2023 Elsevier Patient Education  2024 Elsevier Inc.     Emil Schaumann, MD [facility]

## 2024-07-02 NOTE — Assessment & Plan Note (Signed)
 On left mid posterior neck area Not suspicious for malignancy Nontender.  Movable.  No erythema No fluctuation.  No signs of infection Recommend soft tissue ultrasound

## 2024-07-03 ENCOUNTER — Encounter: Payer: Self-pay | Admitting: Physician Assistant

## 2024-07-03 ENCOUNTER — Encounter: Payer: Self-pay | Admitting: Medical Oncology

## 2024-07-03 ENCOUNTER — Inpatient Hospital Stay: Payer: Self-pay | Attending: Physician Assistant | Admitting: Physician Assistant

## 2024-07-03 ENCOUNTER — Inpatient Hospital Stay: Payer: Self-pay

## 2024-07-03 DIAGNOSIS — R221 Localized swelling, mass and lump, neck: Secondary | ICD-10-CM | POA: Insufficient documentation

## 2024-07-03 DIAGNOSIS — Z87891 Personal history of nicotine dependence: Secondary | ICD-10-CM | POA: Insufficient documentation

## 2024-07-03 DIAGNOSIS — Z9884 Bariatric surgery status: Secondary | ICD-10-CM | POA: Insufficient documentation

## 2024-07-03 LAB — CMP (CANCER CENTER ONLY)
ALT: 11 U/L (ref 0–44)
AST: 16 U/L (ref 15–41)
Albumin: 4.3 g/dL (ref 3.5–5.0)
Alkaline Phosphatase: 59 U/L (ref 38–126)
Anion gap: 5 (ref 5–15)
BUN: 15 mg/dL (ref 6–20)
CO2: 26 mmol/L (ref 22–32)
Calcium: 8.9 mg/dL (ref 8.9–10.3)
Chloride: 107 mmol/L (ref 98–111)
Creatinine: 0.84 mg/dL (ref 0.61–1.24)
GFR, Estimated: >60 mL/min (ref >60)
Glucose, Bld: 96 mg/dL (ref 70–99)
Potassium: 4.2 mmol/L (ref 3.5–5.1)
Sodium: 138 mmol/L (ref 135–145)
Total Bilirubin: 0.4 mg/dL (ref 0.0–1.2)
Total Protein: 6.7 g/dL (ref 6.5–8.1)

## 2024-07-03 LAB — CBC WITH DIFFERENTIAL (CANCER CENTER ONLY)
Abs Immature Granulocytes: 0 K/uL (ref 0.00–0.07)
Basophils Absolute: 0 K/uL (ref 0.0–0.1)
Basophils Relative: 1 %
Eosinophils Absolute: 0.1 K/uL (ref 0.0–0.5)
Eosinophils Relative: 2 %
HCT: 40.6 % (ref 39.0–52.0)
Hemoglobin: 14.4 g/dL (ref 13.0–17.0)
Immature Granulocytes: 0 %
Lymphocytes Relative: 45 %
Lymphs Abs: 2.7 K/uL (ref 0.7–4.0)
MCH: 31.2 pg (ref 26.0–34.0)
MCHC: 35.5 g/dL (ref 30.0–36.0)
MCV: 87.9 fL (ref 80.0–100.0)
Monocytes Absolute: 0.4 K/uL (ref 0.1–1.0)
Monocytes Relative: 7 %
Neutro Abs: 2.8 K/uL (ref 1.7–7.7)
Neutrophils Relative %: 45 %
Platelet Count: 210 K/uL (ref 150–400)
RBC: 4.62 MIL/uL (ref 4.22–5.81)
RDW: 12.4 % (ref 11.5–15.5)
WBC Count: 6.1 K/uL (ref 4.0–10.5)
nRBC: 0 % (ref 0.0–0.2)

## 2024-07-03 LAB — C-REACTIVE PROTEIN: CRP: 0.7 mg/dL (ref <1.0)

## 2024-07-03 LAB — SEDIMENTATION RATE: Sed Rate: 0 mm/h (ref 0–16)

## 2024-07-03 LAB — LACTATE DEHYDROGENASE: LDH: 119 U/L (ref 98–192)

## 2024-07-03 NOTE — Progress Notes (Unsigned)
 Rapid [facility] [phone]   [phone]  INITIAL CONSULTATION:  Patient Care Team: Purcell Emil Schanz, MD as PCP - General (Internal Medicine) Golden Forestine BROCKS, RN as Oncology Nurse Navigator (Medical Oncology)  CHIEF COMPLAINTS/PURPOSE OF CONSULTATION:  Neck mass  HISTORY OF PRESENTING ILLNESS:  Manuel Price 58 y.o. male with medical history significant for seasonal allergies, hypertension, DVT after knee surgery who presents to the rapid diagnostic clinic for evaluation of neck mass. He is accompanied by his daughter for this visit.   On review of the previous records, Mr. Lave presented to the ED on 07/01/2024 due to a left neck mass. CT neck was obtained that showed 2.1 x 2.3 x 1.5 cm irregular mass in the left posterolateral neck subcutaneous tissues along the posterior margin of the sternocleidomastoid with areas of fat attenuation anteromedially and lobulated soft tissue laterally. Patient reports he noticed the mass 4-5 years ago after losing weight from gastric bypass surgery. He reports the mass is nontender and stable in size. He is otherwise feeling well with stable energy and appetite. He denies nausea, vomiting or bowel habit changes. He denies easy bruising or signs of bleeding. He denies fevers, chills, sweats, shortness of breath, chest pain, cough, headaches,dizziness or  dysphagia.   MEDICAL HISTORY:  Past Medical History:  Diagnosis Date   Allergy    SEASONAL   H/O blood clots 15 years +   RIGHT KNEE AFTER SURGERY NO CAUSE FOUND   Hypertension    Lower extremity edema    RIGHT LEG PROPS UP AND GOES DOWN OCC   Sleep apnea    resolved after gastric bypass and weight loss    SURGICAL HISTORY: Past Surgical History:  Procedure Laterality Date   COLONOSCOPY WITH PROPOFOL  N/A 01/30/2018   Procedure: COLONOSCOPY WITH PROPOFOL ;  Surgeon: Albertus Gordy HERO, MD;  Location: [facility];  Service:  Gastroenterology;  Laterality: N/A;   KNEE ARTHROSCOPY Right 20 YRS AGO    SOCIAL HISTORY: Social History   Socioeconomic History   Marital status: Married    Spouse name: Not on file   Number of children: 3   Years of education: Not on file   Highest education level: Not on file  Occupational History   Occupation: Therapist, sports: [employer]  Tobacco Use   Smoking status: Former    Current packs/day: 0.00    Average packs/day: 0.2 packs/day for 3.0 years (0.6 ttl pk-yrs)    Types: Cigarettes    Start date: 07/13/1995    Quit date: 07/12/1998    Years since quitting: 25.9   Smokeless tobacco: Never  Vaping Use   Vaping status: Never Used  Substance and Sexual Activity   Alcohol use: No    Comment: rarely   Drug use: No   Sexual activity: Not on file  Other Topics Concern   Not on file  Social History Narrative   Not on file   Social Drivers of Health   Financial Resource Strain: Not on file  Food Insecurity: No Food Insecurity (07/03/2024)   Hunger Vital Sign    Worried About Running Out of Food in the Last Year: Never true    Ran Out of Food in the Last Year: Never true  Transportation Needs: No Transportation Needs (07/03/2024)   PRAPARE - Administrator, Civil Service (Medical): No    Lack of Transportation (Non-Medical): No  Physical Activity: Not on file  Stress: Not on  file  Social Connections: Not on file  Intimate Partner Violence: Not At Risk (07/03/2024)   Humiliation, Afraid, Rape, and Kick questionnaire    Fear of Current or Ex-Partner: No    Emotionally Abused: No    Physically Abused: No    Sexually Abused: No    FAMILY HISTORY: Family History  Problem Relation Age of Onset   Stroke Mother    Emphysema Maternal Grandmother        non smoker   Colon cancer Neg Hx    Esophageal cancer Neg Hx    Liver cancer Neg Hx    Pancreatic cancer Neg Hx    Rectal cancer Neg Hx    Stomach cancer Neg Hx     ALLERGIES:   has no known allergies.  MEDICATIONS:  Current Outpatient Medications  Medication Sig Dispense Refill   fluticasone  (FLONASE ) 50 MCG/ACT nasal spray Place 1 spray into both nostrils in the morning and at bedtime. (Patient not taking: Reported on 07/03/2024) 16 mL 0   montelukast  (SINGULAIR ) 10 MG tablet Take 1 tablet (10 mg total) by mouth at bedtime. (Patient not taking: Reported on 07/03/2024) 30 tablet 0   No current facility-administered medications for this visit.    REVIEW OF SYSTEMS:   Constitutional: ( - ) fevers, ( - )  chills , ( - ) night sweats Eyes: ( - ) blurriness of vision, ( - ) double vision, ( - ) watery eyes Ears, nose, mouth, throat, and face: ( - ) mucositis, ( - ) sore throat Respiratory: ( - ) cough, ( - ) dyspnea, ( - ) wheezes Cardiovascular: ( - ) palpitation, ( - ) chest discomfort, ( - ) lower extremity swelling Gastrointestinal:  ( - ) nausea, ( - ) heartburn, ( - ) change in bowel habits Skin: ( - ) abnormal skin rashes Lymphatics: ( - ) new lymphadenopathy, ( - ) easy bruising Neurological: ( - ) numbness, ( - ) tingling, ( - ) new weaknesses Behavioral/Psych: ( - ) mood change, ( - ) new changes  All other systems were reviewed with the patient and are negative.  PHYSICAL EXAMINATION: ECOG PERFORMANCE STATUS: 0 - Asymptomatic  There were no vitals filed for this visit. There were no vitals filed for this visit.  GENERAL: well appearing male in NAD  SKIN: skin color, texture, turgor are normal, no rashes or significant lesions EYES: conjunctiva are pink and non-injected, sclera clear OROPHARYNX: no exudate, no erythema; lips, buccal mucosa, and tongue normal  NECK: supple, non-tender. Mobile, nontender palpable mass involving the left neck without erythema.  LYMPH:  no palpable lymphadenopathy in the cervical, axillary or supraclavicular lymph nodes.  LUNGS: clear to auscultation and percussion with normal breathing effort HEART: regular rate &  rhythm and no murmurs and no lower extremity edema ABDOMEN: soft, non-tender, non-distended, normal bowel sounds Musculoskeletal: no cyanosis of digits and no clubbing  PSYCH: alert & oriented x 3, fluent speech NEURO: no focal motor/sensory deficits  LABORATORY DATA:  I have reviewed the data as listed    Latest Ref Rng & Units 07/03/2024    3:20 PM 07/01/2024   12:52 PM 04/26/2023    1:57 PM  CBC  WBC 4.0 - 10.5 K/uL 6.1  6.0  4.9   Hemoglobin 13.0 - 17.0 g/dL 85.5  85.4  85.7   Hematocrit 39.0 - 52.0 % 40.6  41.7  42.7   Platelets 150 - 400 K/uL 210  219  195.0  Latest Ref Rng & Units 07/03/2024    3:20 PM 07/01/2024   12:52 PM 04/26/2023    1:57 PM  CMP  Glucose 70 - 99 mg/dL 96  899  91   BUN 6 - 20 mg/dL 15  16  15    Creatinine 0.61 - 1.24 mg/dL 9.15  9.17  9.12   Sodium 135 - 145 mmol/L 138  140  139   Potassium 3.5 - 5.1 mmol/L 4.2  4.2  4.2   Chloride 98 - 111 mmol/L 107  105  104   CO2 22 - 32 mmol/L 26  23  28    Calcium 8.9 - 10.3 mg/dL 8.9  9.7  9.5   Total Protein 6.5 - 8.1 g/dL 6.7   7.1   Total Bilirubin 0.0 - 1.2 mg/dL 0.4   0.7   Alkaline Phos 38 - 126 U/L 59   57   AST 15 - 41 U/L 16   23   ALT 0 - 44 U/L 11   16      RADIOGRAPHIC STUDIES: I have personally reviewed the radiological images as listed and agreed with the findings in the report. CT Soft Tissue Neck W Contrast Result Date: 07/01/2024 EXAM: CT NECK WITH CONTRAST 07/01/2024 01:53:17 PM TECHNIQUE: CT of the neck was performed with the administration of 75 mL of iohexol  (OMNIPAQUE ) 300 MG/ML solution. Multiplanar reformatted images are provided for review. Automated exposure control, iterative reconstruction, and/or weight based adjustment of the mA/kV was utilized to reduce the radiation dose to as low as reasonably achievable. COMPARISON: None available. CLINICAL HISTORY: neck mass. Lump to left side neck for years. Worse in last two days. FINDINGS: AERODIGESTIVE TRACT: No discrete mass. No  edema. SALIVARY GLANDS: The parotid and submandibular glands are unremarkable. THYROID: Unremarkable. LYMPH NODES: No suspicious cervical lymphadenopathy. SOFT TISSUES: There is a 2.1 x 2.3 x 1.5 cm irregular mass involving the subcutaneous tissues of the left posterolateral neck along the posterior margin of the sternocleidomastoid muscle near the level of C2-C3. This lesion appears to demonstrate areas of fat attenuation along the anteromedial margin with more lobulated soft tissue laterally. There is no significant surrounding inflammatory change. There is no involvement of the underlying musculature. There are no areas of masslike soft tissue involving deep spaces of the neck. BRAIN, ORBITS, SINUSES AND MASTOIDS: No acute abnormality. LUNGS AND MEDIASTINUM: No acute abnormality. BONES: No focal bone abnormality. IMPRESSION: 1. 2.1 x 2.3 x 1.5 cm irregular mass in the left posterolateral neck subcutaneous tissues along the posterior margin of the sternocleidomastoid with areas of fat attenuation anteromedially and lobulated soft tissue laterally. Finding could reflect and enlarged lymph node versus mesenchymal neoplasm. Recommend biopsy for further evaluation. 2. No involvement of deep spaces of the neck. Electronically signed by: Donnice Mania MD 07/01/2024 03:21 PM EDT RP Workstation: [id number]    ASSESSMENT & PLAN Zavien Clubb is a 58 y.o. male who presents to the rapid diagnostic clinic for left neck mass.   #Left neck mass: --Differentials include cyst, lipoma, reactive lymph node, malignant process. Based on physical exam findings, most likely benign process such as cyst versus lipoma.  --Labs today to check CBC, CMP, LDH, flow cytometry, ESR  and CRP --US  neck scheduled for 07/05/2024. Will review these results to determine if biopsy is needed.  --RTC once workup is complete  Orders Placed This Encounter  Procedures   CBC with Differential (Cancer Center Only)    Standing Status:    Future  Number of Occurrences:   1    Expiration Date:   07/03/2025   CMP (Cancer Center only)    Standing Status:   Future    Number of Occurrences:   1    Expiration Date:   07/03/2025   Lactate dehydrogenase (LDH)    Standing Status:   Future    Number of Occurrences:   1    Expiration Date:   07/03/2025   Sedimentation rate    Standing Status:   Future    Number of Occurrences:   1    Expiration Date:   07/03/2025   C-reactive protein    Standing Status:   Future    Number of Occurrences:   1    Expiration Date:   07/03/2025   Flow Cytometry, Peripheral Blood (Oncology)    Standing Status:   Future    Number of Occurrences:   1    Expiration Date:   07/03/2025    All questions were answered. The patient knows to call the clinic with any problems, questions or concerns.  I have spent a total of 60 minutes minutes of face-to-face and non-face-to-face time, preparing to see the patient, obtaining and/or reviewing separately obtained history, performing a medically appropriate examination, counseling and educating the patient, ordering medications/tests/procedures, referring and communicating with other health care professionals, documenting clinical information in the electronic health record, independently interpreting results and communicating results to the patient, and care coordination.   Johnston Police, PA-C Department of Hematology/Oncology [facility] at [facility] Phone: [phone]  Patient was seen with Dr. Onesimo.    ADDENDUM  .Patient was Personally and independently interviewed, examined and relevant elements of the history of present illness were reviewed in details and an assessment and plan was created. All elements of the patient's history of present illness , assessment and plan were discussed in details with Johnston Police, PA-C. The above documentation reflects our combined findings assessment and plan.   Gautam Kale MD MS

## 2024-07-03 NOTE — Progress Notes (Signed)
 [facility]  Patient presented to clinic, with his daughter, for his scheduled appointment with PA-C Johnston Police. I introduced myself and provided them with my direct contact information. Patient and daughter were both encouraged to call me with any questions/concerns they may have.  Colene KYM Raider, RN, BSN, [facility] Oncology Nurse Navigator, [facility] 07/03/2024 3:27 PM

## 2024-07-04 ENCOUNTER — Encounter: Payer: Self-pay | Admitting: Physician Assistant

## 2024-07-05 ENCOUNTER — Ambulatory Visit
Admission: RE | Admit: 2024-07-05 | Discharge: 2024-07-05 | Disposition: A | Discharge status: Home / Self Care | Payer: Self-pay | Source: Ambulatory Visit | Attending: Emergency Medicine | Admitting: Emergency Medicine

## 2024-07-05 DIAGNOSIS — L723 Sebaceous cyst: Secondary | ICD-10-CM

## 2024-07-05 LAB — SURGICAL PATHOLOGY

## 2024-07-08 LAB — FLOW CYTOMETRY

## 2024-07-10 ENCOUNTER — Ambulatory Visit: Payer: Self-pay | Admitting: Emergency Medicine

## 2024-07-10 DIAGNOSIS — R221 Localized swelling, mass and lump, neck: Secondary | ICD-10-CM

## 2024-07-11 ENCOUNTER — Encounter: Payer: Self-pay | Admitting: Medical Oncology

## 2024-07-11 NOTE — Progress Notes (Signed)
 [facility]  Call to patient to confirm plan of surgical referral. Patient confirms hearing from his PCP and that a surgical referral was placed yesterday to [facility] for a surgical resection. Patient states that he does not have an appointment for surgery as of this conversation. Recommended to patient that if he doesn't hear from [facility] in the next two days to call his PCP to let them know. Patient gave verbal understanding. Patient encouraged to call me with questions/concerns.   Colene KYM Raider, RN, BSN, [facility] Oncology Nurse Navigator, [facility] 07/11/2024 1:42 PM

## 2024-07-15 ENCOUNTER — Encounter: Payer: Self-pay | Admitting: Medical Oncology

## 2024-07-15 NOTE — Progress Notes (Signed)
 [facility]  Call to [facility] surgery for status on patient's biopsy schedule date. Per [facility], PCP placed a referral for biopsy. Per [facility], they have not received a referral. This navigator faxed a referral to [facility], requesting a biopsy of neck mass be scheduled.   Colene KYM Raider, RN, BSN, [facility] Oncology Nurse Navigator, [facility] 07/15/2024 11:10 AM

## 2024-07-25 ENCOUNTER — Ambulatory Visit: Payer: Self-pay | Admitting: General Surgery

## 2024-07-25 NOTE — Progress Notes (Signed)
 REFERRING PHYSICIAN:  Purcell Rubin, MD  SURGEON:  CORDELLA BEVERLEY IDLER, MD  MRN: [id number] DOB: 1966-08-16 DATE OF ENCOUNTER: 07/25/2024  Subjective   Chief Complaint: Epidermal Cyst     History of Present Illness:  Manuel Price is a 58 y.o. male who is seen today as an office consultation at the request of Dr. Purcell for evaluation of Epidermal Cyst  58 year old male who presents with a left posterior neck mass. He reports that it has been present for several years and that he first noticed it after he lost weight following bariatric surgery. His family encouraged him to have it evaluated and he underwent a CT that showed a mass within the soft tissues of undetermined etiology. The radiologist said that liposarcoma could not be excluded. He is interested in excision for diagnosis.    Review of Systems: A complete review of systems was obtained from the patient.  I have reviewed this information and discussed as appropriate with the patient.  See HPI as well for other ROS.  Review of Systems  Constitutional: Negative.   HENT: Negative.    Eyes: Negative.   Respiratory: Negative.    Cardiovascular: Negative.   Gastrointestinal: Negative.   Genitourinary: Negative.   Musculoskeletal: Negative.   Skin: Negative.   Neurological: Negative.   Endo/Heme/Allergies: Negative.   Psychiatric/Behavioral: Negative.        Vitals:   07/25/24 1047  BP: 121/80  Pulse: 62  Temp: 36.7 C (98 F)  SpO2: 96%  Weight: (!) 119.7 kg (264 lb)  Height: 182.9 cm (6')  PainSc: 0-No pain    Body mass index is 35.8 kg/m.  No Known Allergies  No current outpatient medications on file prior to visit.   No current facility-administered medications on file prior to visit.    There is no problem list on file for this patient.   No past medical history on file.  Past Surgical History:  Procedure Laterality Date  . bariatric surgery  2019     History reviewed. No  pertinent family history.   Social History   Tobacco Use  Smoking Status Unknown  Smokeless Tobacco Not on file     Social History   Socioeconomic History  . Marital status: Married  Tobacco Use  . Smoking status: Unknown  Substance and Sexual Activity  . Alcohol use: Defer  . Drug use: Defer   Social Drivers of Health   Food Insecurity: No Food Insecurity (07/03/2024)   Received from [facility]   Hunger Vital Sign   . Within the past 12 months, you worried that your food would run out before you got the money to buy more.: Never true   . Within the past 12 months, the food you bought just didn't last and you didn't have money to get more.: Never true  Transportation Needs: No Transportation Needs (07/03/2024)   Received from [facility] - Transportation   . In the past 12 months, has lack of transportation kept you from medical appointments or from getting medications?: No   . In the past 12 months, has lack of transportation kept you from meetings, work, or from getting things needed for daily living?: No  Housing Stability: Unknown (07/25/2024)   Housing Stability Vital Sign   . Homeless in the Last Year: No    Objective:   Physical Exam   Gen: male, NAD HEENT: left posterior neck mass, 2-cm, discrete borders, freely mobile, no drainage, no  erythema  Labs, Imaging and Diagnostic Testing:   Procedure Note:    Assessment and Plan:  58 y/o M with a left posterior neck mass of undetermined etiology. I offered him excision to obtain a diagnosis. I believe that this would best be done in the OR.   Manuel BEVERLEY IDLER, MD   Evaluation and management of this patient required review of outside notes, review of outside labs, an assessment with an independent historian, as well as personal review of his CT scan

## 2024-08-09 ENCOUNTER — Encounter (HOSPITAL_BASED_OUTPATIENT_CLINIC_OR_DEPARTMENT_OTHER): Payer: Self-pay | Admitting: General Surgery

## 2024-08-09 ENCOUNTER — Other Ambulatory Visit: Payer: Self-pay

## 2024-08-16 ENCOUNTER — Encounter (HOSPITAL_BASED_OUTPATIENT_CLINIC_OR_DEPARTMENT_OTHER): Payer: Self-pay | Admitting: General Surgery

## 2024-08-16 ENCOUNTER — Ambulatory Visit (HOSPITAL_BASED_OUTPATIENT_CLINIC_OR_DEPARTMENT_OTHER): Payer: Self-pay | Admitting: Anesthesiology

## 2024-08-16 ENCOUNTER — Ambulatory Visit (HOSPITAL_BASED_OUTPATIENT_CLINIC_OR_DEPARTMENT_OTHER)
Admission: RE | Admit: 2024-08-16 | Discharge: 2024-08-16 | Disposition: A | Discharge status: Home / Self Care | Payer: Self-pay | Attending: General Surgery | Admitting: General Surgery

## 2024-08-16 ENCOUNTER — Other Ambulatory Visit: Payer: Self-pay

## 2024-08-16 ENCOUNTER — Encounter (HOSPITAL_BASED_OUTPATIENT_CLINIC_OR_DEPARTMENT_OTHER)
Admission: RE | Disposition: A | Discharge status: Home / Self Care | Payer: Self-pay | Source: Home / Self Care | Attending: General Surgery

## 2024-08-16 DIAGNOSIS — K219 Gastro-esophageal reflux disease without esophagitis: Secondary | ICD-10-CM | POA: Insufficient documentation

## 2024-08-16 DIAGNOSIS — Z01818 Encounter for other preprocedural examination: Secondary | ICD-10-CM

## 2024-08-16 DIAGNOSIS — D170 Benign lipomatous neoplasm of skin and subcutaneous tissue of head, face and neck: Secondary | ICD-10-CM | POA: Insufficient documentation

## 2024-08-16 DIAGNOSIS — R221 Localized swelling, mass and lump, neck: Secondary | ICD-10-CM

## 2024-08-16 DIAGNOSIS — Z87891 Personal history of nicotine dependence: Secondary | ICD-10-CM | POA: Insufficient documentation

## 2024-08-16 DIAGNOSIS — Z9884 Bariatric surgery status: Secondary | ICD-10-CM | POA: Insufficient documentation

## 2024-08-16 HISTORY — DX: Bariatric surgery status: Z98.84

## 2024-08-16 HISTORY — DX: Gastro-esophageal reflux disease without esophagitis: K21.9

## 2024-08-16 HISTORY — PX: EXCISION MASS NECK: SHX6703

## 2024-08-16 SURGERY — EXCISION, MASS, NECK
Anesthesia: General | Laterality: Left

## 2024-08-16 MED ORDER — LIDOCAINE 2% (20 MG/ML) 5 ML SYRINGE
INTRAMUSCULAR | Status: DC | PRN
  Administered 2024-08-16: 60 mg via INTRAVENOUS

## 2024-08-16 MED ORDER — SODIUM CHLORIDE 0.9 % IV SOLN: 250.0000 mL | INTRAVENOUS | Status: DC | PRN

## 2024-08-16 MED ORDER — ACETAMINOPHEN 325 MG RE SUPP: 650.0000 mg | RECTAL | Status: DC | PRN

## 2024-08-16 MED ORDER — 0.9 % SODIUM CHLORIDE (POUR BTL) OPTIME
TOPICAL | Status: DC | PRN
  Administered 2024-08-16: 1000 mL

## 2024-08-16 MED ORDER — EPHEDRINE SULFATE (PRESSORS) 25 MG/5ML IV SOSY
PREFILLED_SYRINGE | INTRAVENOUS | Status: DC | PRN
  Administered 2024-08-16 (×4): 5 mg via INTRAVENOUS

## 2024-08-16 MED ORDER — CHLORHEXIDINE GLUCONATE CLOTH 2 % EX PADS
6.0000 | MEDICATED_PAD | Freq: Once | CUTANEOUS | Status: AC
  Administered 2024-08-16: 6 via TOPICAL

## 2024-08-16 MED ORDER — MIDAZOLAM HCL 2 MG/2ML IJ SOLN
INTRAMUSCULAR | Status: AC
  Filled 2024-08-16: qty 2

## 2024-08-16 MED ORDER — ACETAMINOPHEN 500 MG PO TABS
1000.0000 mg | ORAL_TABLET | ORAL | Status: AC
  Administered 2024-08-16: 1000 mg via ORAL

## 2024-08-16 MED ORDER — CHLORHEXIDINE GLUCONATE CLOTH 2 % EX PADS: 6.0000 | MEDICATED_PAD | Freq: Once | CUTANEOUS | Status: DC

## 2024-08-16 MED ORDER — LACTATED RINGERS IV SOLN: INTRAVENOUS | Status: DC

## 2024-08-16 MED ORDER — OXYCODONE HCL 5 MG/5ML PO SOLN: 5.0000 mg | Freq: Once | ORAL | Status: DC | PRN

## 2024-08-16 MED ORDER — GABAPENTIN 300 MG PO CAPS
ORAL_CAPSULE | ORAL | Status: AC
Start: 2024-08-16 — End: 2024-08-16
  Filled 2024-08-16: qty 1

## 2024-08-16 MED ORDER — GABAPENTIN 300 MG PO CAPS
300.0000 mg | ORAL_CAPSULE | ORAL | Status: AC
  Administered 2024-08-16: 300 mg via ORAL

## 2024-08-16 MED ORDER — EPHEDRINE 5 MG/ML INJ
INTRAVENOUS | Status: AC
  Filled 2024-08-16: qty 5

## 2024-08-16 MED ORDER — FENTANYL CITRATE (PF) 100 MCG/2ML IJ SOLN
INTRAMUSCULAR | Status: AC
  Filled 2024-08-16: qty 2

## 2024-08-16 MED ORDER — GLYCOPYRROLATE 0.2 MG/ML IJ SOLN
INTRAMUSCULAR | Status: DC | PRN
  Administered 2024-08-16: .1 mg via INTRAVENOUS

## 2024-08-16 MED ORDER — ACETAMINOPHEN 325 MG PO TABS: 650.0000 mg | ORAL_TABLET | ORAL | Status: DC | PRN

## 2024-08-16 MED ORDER — SODIUM CHLORIDE 0.9% FLUSH: 3.0000 mL | Freq: Two times a day (BID) | INTRAVENOUS | Status: DC

## 2024-08-16 MED ORDER — ONDANSETRON HCL 4 MG/2ML IJ SOLN
INTRAMUSCULAR | Status: AC
  Filled 2024-08-16: qty 2

## 2024-08-16 MED ORDER — DEXAMETHASONE SODIUM PHOSPHATE 4 MG/ML IJ SOLN
INTRAMUSCULAR | Status: DC | PRN
  Administered 2024-08-16: 10 mg via INTRAVENOUS

## 2024-08-16 MED ORDER — SUCCINYLCHOLINE CHLORIDE 200 MG/10ML IV SOSY
PREFILLED_SYRINGE | INTRAVENOUS | Status: AC
  Filled 2024-08-16: qty 10

## 2024-08-16 MED ORDER — FENTANYL CITRATE (PF) 100 MCG/2ML IJ SOLN
INTRAMUSCULAR | Status: DC | PRN
  Administered 2024-08-16: 100 ug via INTRAVENOUS

## 2024-08-16 MED ORDER — ACETAMINOPHEN 325 MG PO TABS: 650.0000 mg | ORAL_TABLET | Freq: Four times a day (QID) | ORAL | 0 refills | Status: AC

## 2024-08-16 MED ORDER — IBUPROFEN 200 MG PO TABS: 600.0000 mg | ORAL_TABLET | Freq: Four times a day (QID) | ORAL | 0 refills | Status: AC

## 2024-08-16 MED ORDER — MIDAZOLAM HCL 5 MG/5ML IJ SOLN
INTRAMUSCULAR | Status: DC | PRN
  Administered 2024-08-16: 2 mg via INTRAVENOUS

## 2024-08-16 MED ORDER — SODIUM CHLORIDE 0.9% FLUSH: 3.0000 mL | INTRAVENOUS | Status: DC | PRN

## 2024-08-16 MED ORDER — CEFAZOLIN SODIUM-DEXTROSE 2-4 GM/100ML-% IV SOLN
INTRAVENOUS | Status: AC
  Filled 2024-08-16: qty 100

## 2024-08-16 MED ORDER — CEFAZOLIN SODIUM-DEXTROSE 2-4 GM/100ML-% IV SOLN
2.0000 g | INTRAVENOUS | Status: AC
  Administered 2024-08-16: 2 g via INTRAVENOUS

## 2024-08-16 MED ORDER — MORPHINE SULFATE (PF) 4 MG/ML IV SOLN: 1.0000 mg | INTRAVENOUS | Status: DC | PRN

## 2024-08-16 MED ORDER — ONDANSETRON HCL 4 MG/2ML IJ SOLN
INTRAMUSCULAR | Status: DC | PRN
  Administered 2024-08-16: 4 mg via INTRAVENOUS

## 2024-08-16 MED ORDER — OXYCODONE HCL 5 MG PO TABS: 5.0000 mg | ORAL_TABLET | ORAL | Status: DC | PRN

## 2024-08-16 MED ORDER — SUCCINYLCHOLINE CHLORIDE 200 MG/10ML IV SOSY
PREFILLED_SYRINGE | INTRAVENOUS | Status: DC | PRN
  Administered 2024-08-16: 120 mg via INTRAVENOUS

## 2024-08-16 MED ORDER — PROPOFOL 10 MG/ML IV BOLUS
INTRAVENOUS | Status: DC | PRN
  Administered 2024-08-16: 200 mg via INTRAVENOUS

## 2024-08-16 MED ORDER — PHENYLEPHRINE HCL (PRESSORS) 10 MG/ML IV SOLN
INTRAVENOUS | Status: DC | PRN
  Administered 2024-08-16 (×4): 80 ug via INTRAVENOUS

## 2024-08-16 MED ORDER — FENTANYL CITRATE (PF) 100 MCG/2ML IJ SOLN: 25.0000 ug | INTRAMUSCULAR | Status: DC | PRN

## 2024-08-16 MED ORDER — GLYCOPYRROLATE PF 0.2 MG/ML IJ SOSY
PREFILLED_SYRINGE | INTRAMUSCULAR | Status: AC
  Filled 2024-08-16: qty 1

## 2024-08-16 MED ORDER — AMISULPRIDE (ANTIEMETIC) 5 MG/2ML IV SOLN: 10.0000 mg | Freq: Once | INTRAVENOUS | Status: DC | PRN

## 2024-08-16 MED ORDER — BUPIVACAINE-EPINEPHRINE 0.25% -1:200000 IJ SOLN
INTRAMUSCULAR | Status: DC | PRN
  Administered 2024-08-16: 21 mL

## 2024-08-16 MED ORDER — PHENYLEPHRINE 80 MCG/ML (10ML) SYRINGE FOR IV PUSH (FOR BLOOD PRESSURE SUPPORT)
PREFILLED_SYRINGE | INTRAVENOUS | Status: AC
  Filled 2024-08-16: qty 10

## 2024-08-16 MED ORDER — OXYCODONE HCL 5 MG PO TABS: 5.0000 mg | ORAL_TABLET | Freq: Three times a day (TID) | ORAL | 0 refills | Status: AC | PRN

## 2024-08-16 MED ORDER — LIDOCAINE 2% (20 MG/ML) 5 ML SYRINGE
INTRAMUSCULAR | Status: AC
  Filled 2024-08-16: qty 5

## 2024-08-16 MED ORDER — OXYCODONE HCL 5 MG PO TABS: 5.0000 mg | ORAL_TABLET | Freq: Once | ORAL | Status: DC | PRN

## 2024-08-16 MED ORDER — ACETAMINOPHEN 500 MG PO TABS
ORAL_TABLET | ORAL | Status: AC
  Filled 2024-08-16: qty 2

## 2024-08-16 SURGICAL SUPPLY — 39 items
BENZOIN TINCTURE PRP APPL 2/3 (GAUZE/BANDAGES/DRESSINGS) IMPLANT
BLADE CLIPPER SURG (BLADE) IMPLANT
BLADE SURG 10 STRL SS (BLADE) IMPLANT
BLADE SURG 15 STRL LF DISP TIS (BLADE) ×1 IMPLANT
BNDG ELASTIC 4INX 5YD STR LF (GAUZE/BANDAGES/DRESSINGS) IMPLANT
CANISTER SUCT 1200ML W/VALVE (MISCELLANEOUS) IMPLANT
CHLORAPREP W/TINT 26 (MISCELLANEOUS) ×1 IMPLANT
COVER BACK TABLE 60X90IN (DRAPES) ×1 IMPLANT
COVER MAYO STAND STRL (DRAPES) ×1 IMPLANT
DERMABOND ADVANCED .7 DNX12 (GAUZE/BANDAGES/DRESSINGS) IMPLANT
DRAPE LAPAROTOMY 100X72 PEDS (DRAPES) ×1 IMPLANT
DRAPE UTILITY XL STRL (DRAPES) ×1 IMPLANT
ELECT COATED BLADE 2.86 ST (ELECTRODE) ×1 IMPLANT
ELECTRODE REM PT RTRN 9FT ADLT (ELECTROSURGICAL) ×1 IMPLANT
GLOVE BIO SURGEON STRL SZ7 (GLOVE) ×1 IMPLANT
GLOVE BIOGEL PI IND STRL 7.5 (GLOVE) ×1 IMPLANT
GOWN STRL REUS W/ TWL LRG LVL3 (GOWN DISPOSABLE) ×1 IMPLANT
GOWN STRL REUS W/ TWL XL LVL3 (GOWN DISPOSABLE) ×1 IMPLANT
NDL HYPO 25X1 1.5 SAFETY (NEEDLE) ×1 IMPLANT
NEEDLE HYPO 25X1 1.5 SAFETY (NEEDLE) ×1 IMPLANT
PACK BASIN DAY SURGERY FS (CUSTOM PROCEDURE TRAY) ×1 IMPLANT
PENCIL SMOKE EVACUATOR (MISCELLANEOUS) ×1 IMPLANT
SLEEVE SCD COMPRESS KNEE MED (STOCKING) ×1 IMPLANT
SOLN 0.9% NACL POUR BTL 1000ML (IV SOLUTION) IMPLANT
SPIKE FLUID TRANSFER (MISCELLANEOUS) IMPLANT
SPONGE T-LAP 4X18 ~~LOC~~+RFID (SPONGE) IMPLANT
STAPLER SKIN PROX WIDE 3.9 (STAPLE) IMPLANT
STRIP CLOSURE SKIN 1/2X4 (GAUZE/BANDAGES/DRESSINGS) IMPLANT
SUT MNCRL AB 4-0 PS2 18 (SUTURE) IMPLANT
SUT MON AB 4-0 PC3 18 (SUTURE) ×1 IMPLANT
SUT VIC AB 2-0 SH 18 (SUTURE) IMPLANT
SUT VIC AB 3-0 SH 27X BRD (SUTURE) IMPLANT
SUT VICRYL 3-0 CR8 SH (SUTURE) IMPLANT
SUT VICRYL AB 3 0 TIES (SUTURE) IMPLANT
SYR BULB IRRIG 60ML STRL (SYRINGE) IMPLANT
SYR CONTROL 10ML LL (SYRINGE) ×1 IMPLANT
TOWEL GREEN STERILE FF (TOWEL DISPOSABLE) ×2 IMPLANT
TUBE CONNECTING 20X1/4 (TUBING) IMPLANT
YANKAUER SUCT BULB TIP NO VENT (SUCTIONS) IMPLANT

## 2024-08-16 NOTE — Anesthesia Preprocedure Evaluation (Addendum)
 Anesthesia Evaluation  Patient identified by MRN, date of birth, ID band Patient awake    Reviewed: Allergy & Precautions, NPO status , Patient's Chart, lab work & pertinent test results  History of Anesthesia Complications Negative for: history of anesthetic complications  Airway Mallampati: III  TM Distance: >3 FB Neck ROM: Full    Dental  (+) Dental Advisory Given, Teeth Intact All teeth with dental work:   Pulmonary neg shortness of breath, sleep apnea (does not use CPAP after weight loss) , neg COPD, neg recent URI, former smoker   Pulmonary exam normal breath sounds clear to auscultation       Cardiovascular hypertension, (-) angina + DVT (remote history, completed 1 year of anticoagulation)  (-) Past MI, (-) Cardiac Stents and (-) CABG (-) dysrhythmias  Rhythm:Regular Rate:Normal     Neuro/Psych negative neurological ROS     GI/Hepatic Neg liver ROS,GERD  Medicated,,H/o gastric bypass   Endo/Other  negative endocrine ROS    Renal/GU negative Renal ROS     Musculoskeletal   Abdominal  (+) + obese  Peds  Hematology negative hematology ROS (+) Lab Results      Component                Value               Date                      WBC                      6.1                 07/03/2024                HGB                      14.4                07/03/2024                HCT                      40.6                07/03/2024                MCV                      87.9                07/03/2024                PLT                      210                 07/03/2024              Anesthesia Other Findings   Reproductive/Obstetrics                              Anesthesia Physical Anesthesia Plan  ASA: 2  Anesthesia Plan: General   Post-op Pain Management:    Induction: Intravenous  PONV Risk Score and Plan: 2 and Ondansetron , Dexamethasone  and Treatment may vary due to age or  medical condition  Airway Management Planned: Oral ETT  Additional Equipment:   Intra-op Plan:   Post-operative Plan: Extubation in OR  Informed Consent: I have reviewed the patients History and Physical, chart, labs and discussed the procedure including the risks, benefits and alternatives for the proposed anesthesia with the patient or authorized representative who has indicated his/her understanding and acceptance.     Dental advisory given  Plan Discussed with: CRNA and Anesthesiologist  Anesthesia Plan Comments: (Risks of general anesthesia discussed including, but not limited to, sore throat, hoarse voice, chipped/damaged teeth, injury to vocal cords, nausea and vomiting, allergic reactions, lung infection, heart attack, stroke, and death. All questions answered. )         Anesthesia Quick Evaluation

## 2024-08-16 NOTE — Op Note (Signed)
 08/16/2024  10:58 AM  PATIENT:  Manuel Price  58 y.o. male  Patient Care Team: Purcell Emil Schanz, MD as PCP - General (Internal Medicine) Golden Forestine BROCKS, RN as Oncology Nurse Navigator (Medical Oncology)  PRE-OPERATIVE DIAGNOSIS:  Posterior neck mass  POST-OPERATIVE DIAGNOSIS:  Same (3.5cm x 3cm x 2cm)  PROCEDURE:  Excision of posterior neck mass (3.5cm x 3cm x 2cm)  SURGEON:  Cordella RONAL Idler, MD  ASSISTANT: None  ANESTHESIA:   general  COUNTS:  Sponge, needle and instrument counts were reported correct x2 at the conclusion of the operation.  EBL: Minimal  DRAINS: None  SPECIMEN: Left posterior neck mass  COMPLICATIONS: None  FINDINGS: Lymph node and surrounding inflammatory tissue  DISPOSITION: PACU in satisfactory condition  INDICATION:  [name] is a 58 y/o M who developed an area of swelling along his posterior left neck. He underwent imaging which was concerning for a mass or inflammatory process. I offered him an excisional biopsy in the OR. All of his questions were addressed and written consent was obtained with assistance of a Spanish interpreter.   DESCRIPTION: The patient was identified in preop holding and taken to the OR where he was placed on the operating room table. SCDs were placed. General endotracheal anesthesia was induced without difficulty. He was then positioned prone and his posterior left neck was then prepped and draped in the usual sterile fashion. A surgical timeout was performed indicating the correct patient, procedure, positioning and need for preoperative antibiotics.  The previously marked area near the left neck was identified. A incision was made overlying the mass using a #15 blade and carried down through the subcutaneous tissue using electrocautery. The mass was encountered and appeared to be inflammatory tissue surrounding a lymph node. The mass of tissue was freed from surrounding tissues using blunt dissection and  electrocautery, excised, and then passed off the field to be sent as a specimen. The mass measured 3.5 x 3.0 x 2.0cm. The wound was irrigated with sterile saline and inspected for hemostasis. The deep layers were closed with interrupted 2-0 vicryl. Interrupted 3-0 vicryl were used to approximate the dermis followed by a running 4-0 subcuticular monocryl. A layer of dermabond was applied.

## 2024-08-16 NOTE — Transfer of Care (Signed)
 Immediate Anesthesia Transfer of Care Note  Patient: Manuel Price  Procedure(s) Performed: EXCISION, MASS, NECK (Left)  Patient Location: PACU  Anesthesia Type:General  Level of Consciousness: drowsy  Airway & Oxygen Therapy: Patient Spontanous Breathing  Post-op Assessment: Report given to RN and Post -op Vital signs reviewed and stable  Post vital signs: Reviewed and stable  Last Vitals:  Vitals Value Taken Time  BP 147/73 08/16/24 10:28  Temp    Pulse 81 08/16/24 10:30  Resp 14 08/16/24 10:30  SpO2 96 % 08/16/24 10:30  Vitals shown include unfiled device data.  Last Pain:  Vitals:   08/16/24 0705  TempSrc: Oral  PainSc: 0-No pain      Patients Stated Pain Goal: 4 (08/16/24 0705)  Complications: No notable events documented.

## 2024-08-16 NOTE — H&P (Signed)
     Eric Sanders 1966-07-17  [id number].    HPI:  58 y/o M with a subcutaneous mass along the left posterior neck who presents for elective excision. He reports that he is in his usual state of health and denies any recent changes in medication.   ROS: ROS  Family History  Problem Relation Age of Onset   Stroke Mother    Emphysema Maternal Grandmother        non smoker   Colon cancer Neg Hx    Esophageal cancer Neg Hx    Liver cancer Neg Hx    Pancreatic cancer Neg Hx    Rectal cancer Neg Hx    Stomach cancer Neg Hx     Past Medical History:  Diagnosis Date   Allergy    SEASONAL   GERD (gastroesophageal reflux disease)    H/O blood clots 15 years +   RIGHT KNEE AFTER SURGERY NO CAUSE FOUND   H/O gastric bypass    2019   Hypertension    none since bypass surgery   Lower extremity edema    RIGHT LEG PROPS UP AND GOES DOWN OCC   Sleep apnea    resolved after gastric bypass and weight loss    Past Surgical History:  Procedure Laterality Date   COLONOSCOPY WITH PROPOFOL  N/A 01/30/2018   Procedure: COLONOSCOPY WITH PROPOFOL ;  Surgeon: Albertus Gordy HERO, MD;  Location: [facility];  Service: Gastroenterology;  Laterality: N/A;   GASTRIC BYPASS Right    KNEE ARTHROSCOPY Right 20 YRS AGO    Social History:  reports that he quit smoking about 26 years ago. His smoking use included cigarettes. He started smoking about 29 years ago. He has a 0.6 pack-year smoking history. He has never used smokeless tobacco. He reports current alcohol use. He reports that he does not use drugs.  Allergies: No Known Allergies  Medications Prior to Admission  Medication Sig Dispense Refill   omeprazole  (PRILOSEC) 20 MG capsule Take 20 mg by mouth daily.     fluticasone  (FLONASE ) 50 MCG/ACT nasal spray Place into both nostrils daily.     montelukast  (SINGULAIR ) 10 MG tablet Take 10 mg by mouth at bedtime.      Physical Exam: Blood pressure 130/89, pulse (!) 54, temperature 98.4 F  (36.9 C), temperature source Oral, resp. rate 16, height 5' 11 (1.803 m), weight 118.7 kg, SpO2 98%. Gen: male, NAD HEENT: palpable mass along the left posterior neck marked  No results found for this or any previous visit (from the past 48 hours). No results found.  Assessment/Plan 58 y/o M with a left posterior neck mass  - Will proceed to the OR. We discussed the alternatives and potential risks of surgery, including but not limited to: bleeding, infection, damage to surrounding structures, recurrence, wound complications, and need for additional procedures. All questions were addressed and consent was obtained.    Cordella DELENA Polly Marlis Cheron Surgery 08/16/2024, 7:25 AM Please see Amion for pager number during day hours 7:00am-4:30pm or 7:00am -11:30am on weekends

## 2024-08-16 NOTE — Anesthesia Postprocedure Evaluation (Signed)
 Anesthesia Post Note  Patient: Manuel Price  Procedure(s) Performed: EXCISION, MASS, NECK (Left)     Patient location during evaluation: PACU Anesthesia Type: General Level of consciousness: awake Pain management: pain level controlled Vital Signs Assessment: post-procedure vital signs reviewed and stable Respiratory status: spontaneous breathing, nonlabored ventilation and respiratory function stable Cardiovascular status: blood pressure returned to baseline and stable Postop Assessment: no apparent nausea or vomiting Anesthetic complications: no   No notable events documented.  Last Vitals:  Vitals:   08/16/24 1100 08/16/24 1105  BP: 123/89   Pulse: (!) 53 70  Resp: 14 11  Temp:    SpO2: 98% 95%    Last Pain:  Vitals:   08/16/24 1100  TempSrc:   PainSc: 0-No pain                 Delon Aisha Arch

## 2024-08-16 NOTE — Discharge Instructions (Addendum)
 No tylenol  before 2pm.  Outpatient Surgery Home Care Instruction  Activity  The effects of anesthesia are still present and drowsiness may result.  Limit activity for the first 24 hours, then you may return to normal daily activities. Returning to normal daily activities as soon as you can following surgery will enhance recovery time.  Do not drive or operate heavy machinery within 24 hours of taking narcotic pain medications.   Do not mow the lawn, use a vacuum cleaner, or do any other strenuous activities without first consulting your surgical team.   Diet Drink plenty of fluids and eat light meals today, then resume regular diet. Some patients may find their appetite is poor for a week or two after surgery. This is a normal result of the stress of surgery-your appetite will return in time.   There are no specific diet restrictions after surgery.   Dressing and Wound Care  Keep your wound or incision site clean and dry.  You may have different types of dressings covering your incisions depending on your operation and your surgeon: Dermabond/Durabond (skin glue): This will usually remain in place for 10-14 days, then naturally fall off your skin. You may take a shower 24 hrs after surgery, carefully wash, not scrub the incision site with a mild non-scented soap. Pat dry with a soft towel.  Do not pick or peel skin glue off.  You can shower and let the water fall on the dressings above. Do not soak or submerge your incision(s) in a bath tub, hot tub, or swimming pool, until your doctor says it is ok to do so or the incision(s) have completely healed, usually about 2-4 weeks.  Do not use creams, powder, salves or balms on your incision(s).  What to Expect After Surgery   Moderate discomfort controlled with medications  Minimal drainage from incision  Feeling fatigue and weak  Constipation after surgery is common. Drink plenty fluids and eat a high fiber diet.   Pain Control: Prescribed  Non-Narcotic Pain Medication  You will be given three prescriptions.  Two of them will be for prescription strength ibuprofen (i.e. Advil) and prescription strength acetaminophen  (i.e. Tylenol ).  The vast majority of patients will just need these two medications.  One prescription will be for a 'rescue' prescription of an oral narcotic (oxycodone ).  You may fill this if needed.  You will alternate taking the ibuprofen (600mg ) every 6 hours and also the acetaminophen  (650mg ) every 6 hours so that you are taking one of those medications every 3 hours.  For example: o 0800 - take ibuprofen 600mg  o 1100 - take acetaminophen  650mg  o 1400 - take ibuprofen 600mg  o 1700 - take acetaminophen  650mg  o Etc.  Continue taking this alternating pattern of ibuprofen and acetaminophen  for 3 days  If you cannot take one or the other of these medications, just take the one you can every 6 hours.  If you are comfortable at night, you don't have to wake up and take a medication.  If you are still uncomfortable after taking either ibuprofen or acetaminophen , try gentle stretching exercise and ice packs (a bag of frozen vegetables works great).  If you are still uncomfortable, you may fill the narcotic prescription of Oxycodone  and take as directed.  Once you have completed these prescriptions, your pain level should be low enough to stop taking medications altogether or just use an over the counter medication (ibuprofen or acetaminophen ) as needed.    Pain Control: Over the Counter  Medications to take as needed  Colace/Docusate: May be prescribed by your surgeon to prevent constipation caused by the combination of narcotics, effects of anesthesia, and decreased ambulation.  Hold for loose stools or diarrhea. Take 100 mg 1-2 times a day starting tonight.   Fiber: High fiber foods, extra liquids (water 9-13 cups/day) can also assist with constipation. Examples of high fiber foods are fruit, bran. Prune juice and water are  also good liquids to drink.  Milk of Magnesia/Miralax:  If constipated despite takeing the over the counter stool softeners, you may take Milk of Magnesia or Miralax as directed on bottle to assist with constipation.     Pepcid/Famotidine: May be prescribed while taking naproxen (Aleve) or other NSAIDs such as ibuprofen (Motrin/Advil) to prevent stomach upset or Acid-reflux symptoms. Take 1 tablet 1-2 times a day.   **Constipation: The first bowel movement may occur anywhere between 1-5 days after surgery.  As long as you are not nauseated or not having significant abdominal pain this variation is acceptable. Narcotic pain medications can cause constipation increasing discomfort; early discontinuation will assist with bowel management. If constipated despite taking stool softeners, you may take Milk of Magnesia or Miralax as directed on the bottle.     **Home medications: You may restart your home medications as directed by your respective Primary Care Physician or Surgeon.   When to notify your Doctor or Healthcare Team   Sign of Wound Infection   Fever over 100 degrees.  Wound becomes extremely swollen, shows red streaks, warm to the touch, and/or drainage from the incision site or foul-smelling drainage.  Wound edges separate or opens up  Bleeding or bruising   If you have bleeding, apply pressure to the site and hold the pressure firmly for 5 minutes. If the bleeding continues, apply pressure again and call 911. If the bleeding stopped, call your doctor to report it.   Call your doctor or nurse if you have increased bleeding from your site and increased bruising or a lump forms or gets larger under your skin at the site. Unrelieved Pain   Call your doctor or nurse if your pain gets worse or is not eased 1 hour after taking your pain medicine, or if it is severe and uncontrolled. Nausea and Vomiting   Call your doctor or nurse if you have nausea and vomiting that continues more than 24 hours,  will not let you keep medicine down and will not let you keep fluids down  Fever, Flu-like symptoms   Fever over 100 degrees and/or chills  Gastrointestinal Bleeding Symptoms    Black tarry bowel movements.  This can be normal after surgery on the stomach, but should resolve in a day or two.    Call 911 if you suddenly have signs of blood loss such as:  Vomiting blood  Fast heart rate  Feeling faint, sweaty, or blacking out  Passing bright red blood from your rectum  Blood Clot Symptoms   Tender, swollen or reddened areas in your calf muscle or thighs.  Numbness or tingling in your lower leg or calf, or at the top of your leg or groin  Skin on your leg looks pale or blue or feels cold to touch  Chest pain or have trouble breathing, lightheadedness, fast heart rate  Sudden Onset of Symptoms    Call 911 if you suddenly have:  Leg weakness and spasm  Loss of bladder or bowel function  Seizure  Confusion, severe headache, dizziness or feeling  unsteady, problems talking, difficulty swallowing, and/or numbness or muscle weakness as these could be signs of a stroke.  Follow up Appointment Your follow up appointment should be scheduled 2-3 weeks after your surgery date.  If you have not previously scheduled for a follow-up visit you can be scheduled by contacting [phone].   Post Anesthesia Home Care Instructions  Activity: Get plenty of rest for the remainder of the day. A responsible individual must stay with you for 24 hours following the procedure.  For the next 24 hours, DO NOT: -Drive a car -Advertising copywriter -Drink alcoholic beverages -Take any medication unless instructed by your physician -Make any legal decisions or sign important papers.  Meals: Start with liquid foods such as gelatin or soup. Progress to regular foods as tolerated. Avoid greasy, spicy, heavy foods. If nausea and/or vomiting occur, drink only clear liquids until the nausea and/or vomiting subsides.  Call your physician if vomiting continues.  Special Instructions/Symptoms: Your throat may feel dry or sore from the anesthesia or the breathing tube placed in your throat during surgery. If this causes discomfort, gargle with warm salt water. The discomfort should disappear within 24 hours.  If you had a scopolamine patch placed behind your ear for the management of post- operative nausea and/or vomiting:  1. The medication in the patch is effective for 72 hours, after which it should be removed.  Wrap patch in a tissue and discard in the trash. Wash hands thoroughly with soap and water. 2. You may remove the patch earlier than 72 hours if you experience unpleasant side effects which may include dry mouth, dizziness or visual disturbances. 3. Avoid touching the patch. Wash your hands with soap and water after contact with the patch.

## 2024-08-16 NOTE — Anesthesia Procedure Notes (Signed)
 Procedure Name: Intubation Date/Time: 08/16/2024 9:30 AM  Performed by: Franchot Izetta ORN, CRNAPre-anesthesia Checklist: Patient identified, Emergency Drugs available, Suction available and Patient being monitored Patient Re-evaluated:Patient Re-evaluated prior to induction Oxygen Delivery Method: Circle system utilized Preoxygenation: Pre-oxygenation with 100% oxygen Induction Type: IV induction Ventilation: Mask ventilation without difficulty Laryngoscope Size: Miller and 2 Grade View: Grade I Tube type: Oral Tube size: 7.5 mm Number of attempts: 1 Airway Equipment and Method: Stylet Placement Confirmation: ETT inserted through vocal cords under direct vision, positive ETCO2 and breath sounds checked- equal and bilateral Secured at: 24.5 cm Tube secured with: Tape Dental Injury: Teeth and Oropharynx as per pre-operative assessment

## 2024-08-17 ENCOUNTER — Encounter (HOSPITAL_BASED_OUTPATIENT_CLINIC_OR_DEPARTMENT_OTHER): Payer: Self-pay | Admitting: General Surgery

## 2024-08-19 LAB — SURGICAL PATHOLOGY

## 2024-08-22 ENCOUNTER — Encounter: Payer: Self-pay | Admitting: Physician Assistant

## 2024-08-23 ENCOUNTER — Encounter: Payer: Self-pay | Admitting: Medical Oncology

## 2024-08-23 NOTE — Progress Notes (Signed)
 Rapid Diagnostic Service for Malignancy  Hand-off Note  08/23/24 1:30 PM  Manuel Price 1966/02/01 [id number]  Cancer Care, Care Team: Manuel Saran, MD Manuel Police, PA-C Manuel Price, Diagnostic Nurse Navigator  Manuel Price was referred to Cancer Care on 07/01/2024 for evaluation of neck mass.  The patient's diagnostic work-up included: Labs: CBC, CMP. Lactate dehydrogenase, Sedimentation rate. Flow cytometry, C-reach protein. US  neck 07/05/2024  3.   Surgical Pathology 08/16/2024  The patient was found to not have malignancy at this time, as evaluated for the reason for referral stated above.  The recommended follow-up provided to the patient includes:  follow up with PCP as needed. We thank you for allowing us  to assist in [facility] care.  The initial and most recent Progress Notes, labs, imaging, procedure(s), and/or consult notes have been routed to you through [facility] or faxed to your office for continuity of care.    Manuel KYM Raider, RN, BSN, [facility] Oncology Nurse Navigator, Rapid Diagnostic Services 08/23/2024 1:37 PM
